# Patient Record
Sex: Female | Born: 1960 | Race: White | Hispanic: No | Marital: Married | Smoking: Never smoker
Health system: Southern US, Community
[De-identification: ages and names within clinical notes are randomized; demographics above are authoritative.]

## PROBLEM LIST (undated history)

## (undated) DIAGNOSIS — T7840XA Allergy, unspecified, initial encounter: Secondary | ICD-10-CM

## (undated) HISTORY — DX: Allergy, unspecified, initial encounter: T78.40XA

---

## 2001-04-25 ENCOUNTER — Other Ambulatory Visit: Admission: RE | Admit: 2001-04-25 | Discharge: 2001-04-25 | Payer: Self-pay | Admitting: Obstetrics and Gynecology

## 2001-10-28 ENCOUNTER — Encounter: Payer: Self-pay | Admitting: Obstetrics and Gynecology

## 2001-10-28 ENCOUNTER — Inpatient Hospital Stay (HOSPITAL_COMMUNITY): Admission: AD | Admit: 2001-10-28 | Discharge: 2001-10-28 | Payer: Self-pay | Admitting: Obstetrics and Gynecology

## 2001-11-19 ENCOUNTER — Inpatient Hospital Stay (HOSPITAL_COMMUNITY): Admission: AD | Admit: 2001-11-19 | Discharge: 2001-11-21 | Payer: Self-pay | Admitting: Obstetrics and Gynecology

## 2001-11-22 ENCOUNTER — Encounter: Admission: RE | Admit: 2001-11-22 | Discharge: 2001-12-22 | Payer: Self-pay | Admitting: Obstetrics and Gynecology

## 2001-12-23 ENCOUNTER — Encounter: Admission: RE | Admit: 2001-12-23 | Discharge: 2002-01-22 | Payer: Self-pay | Admitting: Obstetrics and Gynecology

## 2001-12-31 ENCOUNTER — Other Ambulatory Visit: Admission: RE | Admit: 2001-12-31 | Discharge: 2001-12-31 | Payer: Self-pay | Admitting: Obstetrics & Gynecology

## 2002-02-20 ENCOUNTER — Encounter: Admission: RE | Admit: 2002-02-20 | Discharge: 2002-03-22 | Payer: Self-pay | Admitting: Obstetrics and Gynecology

## 2002-04-22 ENCOUNTER — Encounter: Admission: RE | Admit: 2002-04-22 | Discharge: 2002-05-22 | Payer: Self-pay | Admitting: Obstetrics and Gynecology

## 2002-06-22 ENCOUNTER — Encounter: Admission: RE | Admit: 2002-06-22 | Discharge: 2002-07-22 | Payer: Self-pay | Admitting: Obstetrics and Gynecology

## 2003-07-15 ENCOUNTER — Other Ambulatory Visit: Admission: RE | Admit: 2003-07-15 | Discharge: 2003-07-15 | Payer: Self-pay | Admitting: Obstetrics and Gynecology

## 2004-09-22 ENCOUNTER — Other Ambulatory Visit: Admission: RE | Admit: 2004-09-22 | Discharge: 2004-09-22 | Payer: Self-pay | Admitting: Obstetrics and Gynecology

## 2004-10-01 ENCOUNTER — Encounter: Admission: RE | Admit: 2004-10-01 | Discharge: 2004-10-01 | Payer: Self-pay | Admitting: Obstetrics and Gynecology

## 2004-10-06 ENCOUNTER — Encounter: Admission: RE | Admit: 2004-10-06 | Discharge: 2004-10-06 | Payer: Self-pay | Admitting: Obstetrics and Gynecology

## 2004-10-06 ENCOUNTER — Encounter (INDEPENDENT_AMBULATORY_CARE_PROVIDER_SITE_OTHER): Payer: Self-pay | Admitting: *Deleted

## 2005-12-19 ENCOUNTER — Other Ambulatory Visit: Admission: RE | Admit: 2005-12-19 | Discharge: 2005-12-19 | Payer: Self-pay | Admitting: Obstetrics and Gynecology

## 2006-05-10 ENCOUNTER — Ambulatory Visit: Payer: Self-pay | Admitting: Family Medicine

## 2006-11-22 ENCOUNTER — Ambulatory Visit: Payer: Self-pay | Admitting: Family Medicine

## 2007-04-19 ENCOUNTER — Ambulatory Visit: Payer: Self-pay | Admitting: Family Medicine

## 2008-08-25 ENCOUNTER — Ambulatory Visit: Payer: Self-pay | Admitting: Family Medicine

## 2009-11-04 ENCOUNTER — Encounter: Admission: RE | Admit: 2009-11-04 | Discharge: 2009-11-04 | Payer: Self-pay | Admitting: Obstetrics and Gynecology

## 2010-01-07 ENCOUNTER — Ambulatory Visit: Payer: Self-pay | Admitting: Family Medicine

## 2010-08-20 ENCOUNTER — Ambulatory Visit: Payer: Self-pay | Admitting: Family Medicine

## 2011-11-04 ENCOUNTER — Ambulatory Visit (INDEPENDENT_AMBULATORY_CARE_PROVIDER_SITE_OTHER): Payer: 59 | Admitting: Medical

## 2011-11-04 ENCOUNTER — Encounter: Payer: Self-pay | Admitting: Medical

## 2011-11-04 VITALS — BP 98/60 | HR 68 | Temp 98.6°F | Resp 16 | Wt 106.5 lb

## 2011-11-04 DIAGNOSIS — R509 Fever, unspecified: Secondary | ICD-10-CM

## 2011-11-04 DIAGNOSIS — B349 Viral infection, unspecified: Secondary | ICD-10-CM | POA: Insufficient documentation

## 2011-11-04 DIAGNOSIS — B9789 Other viral agents as the cause of diseases classified elsewhere: Secondary | ICD-10-CM

## 2011-11-04 DIAGNOSIS — J029 Acute pharyngitis, unspecified: Secondary | ICD-10-CM | POA: Insufficient documentation

## 2011-11-04 LAB — POCT INFLUENZA A/B
Influenza A, POC: NEGATIVE
Influenza B, POC: NEGATIVE

## 2011-11-04 LAB — POCT RAPID STREP A (OFFICE): Rapid Strep A Screen: NEGATIVE

## 2011-11-04 NOTE — Progress Notes (Signed)
Subjective:   HPI  Cheryl Sanchez is a 50 y.o. female who presents with sore throat, fever, body aches x 1 days.  Symptoms began suddenly yesterday.  Ears hurt, glands feel swollen, tired and achy.  Occasional cough from dry throat.  No headache, no nausea, vomiting, diarrhea, belly pain.   She notes some nasal congestion, but no sinus pressure.  She did not get flu shot this year.  Was in doctor's office with her kids Monday and lots of sick kids there.   She has been around positive sick contacts with sinusitis and cold.  No other aggravating or relieving factors.  Using alka seltzer cold plus.   Using some Tylenol as well.   No other c/o.  The following portions of the patient's history were reviewed and updated as appropriate: allergies, current medications, past family history, past medical history, past social history, past surgical history and problem list.  Past Medical History  Diagnosis Date  . Allergy   . Endometriosis    Review of Systems Constitutional: +fever, -+Chills, +sweats, -unexpected -weight change,+fatigue ENT: -runny nose, +ear pain, +sore throat,+CONGESTION Cardiology:  -chest pain, -palpitations, -edema Respiratory: -cough, -shortness of breath, -wheezing Gastroenterology: -abdominal pain, -nausea, -vomiting, -diarrhea, -constipation Hematology: -bleeding or bruising problems Musculoskeletal: -arthralgias, -myalgias, -joint swelling, -back pain Ophthalmology: -vision changes Urology: -dysuria, -difficulty urinating, -hematuria, -urinary frequency, -urgency Neurology: -headache, -weakness, -tingling, -numbness   Objective:      General: Ill-appearing, well-developed, well-nourished, ill appearing Skin: warm, moist HEENT: Nose inflamed and congested, clear conjunctiva, TMs pearly, no sinus tenderness, pharynx with erythema, no exudates Neck: Supple, nontender, shotty cervical adenopathy Heart: Regular rate and rhythm, normal S1, S2, no murmurs Lungs: Clear  to auscultation bilaterally, no wheezes, rales, rhonchi Abdomen: Nontender non distended Extremities: Mild generalized tenderness      Assessment and Plan:   Encounter Diagnoses  Name Primary?  . Fever Yes  . Pharyngitis    Strep and Flu tests negative.  Despite this, given sick contacts, recent flu in community, will treat presumably for flu like viral illness.  Discussed supportive care, prevention, possible complications.  She will call if worse or not improving.

## 2011-11-10 ENCOUNTER — Ambulatory Visit (INDEPENDENT_AMBULATORY_CARE_PROVIDER_SITE_OTHER): Payer: 59 | Admitting: Family Medicine

## 2011-11-10 ENCOUNTER — Encounter: Payer: Self-pay | Admitting: Family Medicine

## 2011-11-10 VITALS — HR 77 | Temp 98.2°F | Wt 108.0 lb

## 2011-11-10 DIAGNOSIS — J111 Influenza due to unidentified influenza virus with other respiratory manifestations: Secondary | ICD-10-CM

## 2011-11-10 MED ORDER — AZITHROMYCIN 500 MG PO TABS: 500.0000 mg | ORAL_TABLET | Freq: Every day | ORAL | Status: AC

## 2011-11-10 NOTE — Progress Notes (Signed)
  Subjective:    Patient ID: Cheryl Sanchez, female    DOB: 06-25-1961, 50 y.o.   MRN: [id number]  HPI She complains of continued difficulty with cough ,rib pain, headache, fatigue and hoarse voice. No fever,chills sore throat   Review of Systems     Objective:   Physical Exam alert and in no distress. Tympanic membranes and canals are normal. Throat is clear. Tonsils are normal. Neck is supple without adenopathy or thyromegaly. Cardiac exam shows a regular sinus rhythm without murmurs or gallops. Lungs are clear to auscultation.        Assessment & Plan:  Bronchitis. Treat with azithromycin. Call if no better at the end of the med

## 2011-11-10 NOTE — Patient Instructions (Signed)
Use Advil 4 tabs three times a day.Call if not back to normal in one week

## 2011-11-11 ENCOUNTER — Telehealth: Payer: Self-pay | Admitting: Family Medicine

## 2011-11-11 NOTE — Telephone Encounter (Signed)
DONE

## 2011-11-23 ENCOUNTER — Telehealth: Payer: Self-pay | Admitting: Internal Medicine

## 2011-11-23 MED ORDER — AZITHROMYCIN 500 MG PO TABS: 500.0000 mg | ORAL_TABLET | Freq: Every day | ORAL | Status: AC

## 2011-11-23 NOTE — Telephone Encounter (Signed)
Azithromycin called in. If she still has difficulty in another week, she will return for reevaluation

## 2011-12-05 ENCOUNTER — Other Ambulatory Visit (INDEPENDENT_AMBULATORY_CARE_PROVIDER_SITE_OTHER): Payer: 59

## 2011-12-05 ENCOUNTER — Telehealth: Payer: Self-pay | Admitting: *Deleted

## 2011-12-05 DIAGNOSIS — Z23 Encounter for immunization: Secondary | ICD-10-CM

## 2011-12-05 NOTE — Telephone Encounter (Signed)
Cheryl Sanchez, this was the patient that was on the nurse schedule for a Tdap that you assessed in the lab room. Thanks.

## 2011-12-05 NOTE — Telephone Encounter (Signed)
I saw pt briefly today when she came for Tdap booster.  Her right thumb landed on a rusty carpet tack.  No redness, or induration, no warmth.  She had cleaned the wound nicely.   Advise watch and wait approach.  No antibiotic today.   Tdap update.   She will return if worsening signs of infection.

## 2012-06-28 ENCOUNTER — Encounter: Payer: Self-pay | Admitting: Family Medicine

## 2012-06-28 ENCOUNTER — Ambulatory Visit (INDEPENDENT_AMBULATORY_CARE_PROVIDER_SITE_OTHER): Payer: BC Managed Care – PPO | Admitting: Family Medicine

## 2012-06-28 VITALS — BP 120/70 | Wt 114.0 lb

## 2012-06-28 DIAGNOSIS — L259 Unspecified contact dermatitis, unspecified cause: Secondary | ICD-10-CM

## 2012-06-28 MED ORDER — SODIUM CHLORIDE 0.9 % IV SOLN: 125.0000 mg | Freq: Once | INTRAVENOUS | Status: DC

## 2012-06-28 MED ORDER — METHYLPREDNISOLONE SODIUM SUCC 125 MG IJ SOLR
125.0000 mg | Freq: Once | INTRAMUSCULAR | Status: AC
  Administered 2012-06-28: 125 mg via INTRAMUSCULAR

## 2012-06-28 MED ORDER — PREDNISONE 10 MG PO KIT: PACK | ORAL | Status: DC

## 2012-06-28 NOTE — Progress Notes (Signed)
  Subjective:    Patient ID: Cheryl Sanchez, female    DOB: [birth year], 51 y.o.   MRN: [id number]  HPI She complains of a three-day history of redness and itching on various spots of her body. She has no history of exposure to any plant materials. She does have a cat.   Review of Systems     Objective:   Physical Exam Scattered erythematous lesions some of which are linear in nature noted. She also has a roundish follicular area that is erythematous and slightly warm on her right anterior thigh.       Assessment & Plan:   1. Contact dermatitis  methylPREDNISolone sodium succinate (SOLU-MEDROL) 130 mg in sodium chloride 0.9 % 50 mL IVPB, PredniSONE (STERAPRED DS) 10 MG KIT   she will let me know how the lesion on her thighs progressing and if it gets worse, possible antibiotic will be given. Also recommend she washed her clothes in hot soapy water

## 2012-09-11 ENCOUNTER — Other Ambulatory Visit (INDEPENDENT_AMBULATORY_CARE_PROVIDER_SITE_OTHER): Payer: BC Managed Care – PPO

## 2012-09-11 DIAGNOSIS — Z23 Encounter for immunization: Secondary | ICD-10-CM

## 2012-10-01 ENCOUNTER — Ambulatory Visit (INDEPENDENT_AMBULATORY_CARE_PROVIDER_SITE_OTHER): Payer: BC Managed Care – PPO | Admitting: Family Medicine

## 2012-10-01 ENCOUNTER — Encounter: Payer: Self-pay | Admitting: Family Medicine

## 2012-10-01 VITALS — BP 140/80 | HR 80 | Ht 62.0 in | Wt 121.0 lb

## 2012-10-01 DIAGNOSIS — S0990XA Unspecified injury of head, initial encounter: Secondary | ICD-10-CM

## 2012-10-01 NOTE — Progress Notes (Signed)
Chief Complaint  Patient presents with  . Head Injury    tent pole fell on her head this am, around 11am. Having HA, feels a little sleepy and having some fuzzy headed feeling.    HPI:  This morning, around 11 am, while taking a tent down after an event, one of the horizontal poles slipped out of joint, and slid down and hit her in the center of the top of her head, knocking her to the ground.  Denies any LOC.  She put ice on her head, which felt better, and she continued to work, unloading.  She had some mild nausea, but hadn't eaten anything.  Nausea resolved after eating. After going back to work she had trouble concentrating and hard to concentrate to look at computer screen.  Denies any visual complaints.  Complaining of headache.  Took 2 ibuprofen, and headache is somewhat better.  Past Medical History  Diagnosis Date  . Allergy   . Endometriosis    History   Social History  . Marital Status: Single    Spouse Name: N/A    Number of Children: N/A  . Years of Education: N/A   Occupational History  . Not on file.   Social History Main Topics  . Smoking status: Never Smoker   . Smokeless tobacco: Not on file  . Alcohol Use: No  . Drug Use: No  . Sexually Active: Not on file   Other Topics Concern  . Not on file   Social History Narrative  . No narrative on file   Meds: none currently No Known Allergies  ROS: Denies numbness, tingling, vertigo, weakness, GI/GU problems, bleeding/bruising or other concerns.  No fevers, URI symptoms, shortness of breath, chest pain.  PHYSICAL EXAM: BP 140/80  Pulse 80  Ht 5\' 2"  (1.575 m)  Wt 121 lb (54.885 kg)  BMI 22.13 kg/m2  LMP 10/13/2011 Well developed, pleasant female in no distress HEENT:  PERRL, EOMI, fundi benign, conjunctiva clear.  TM's and EAC's normal ,OP clear.  Tender over mild soft tissue swelling anterior scalp.  Skin intact. Neck: no lymphadenopathy or mass.  No spine tenderness Back: no spine tenderness Neuro:  alert and oriented x 4.  Cranial nerves intact.  Normal strength, sensation.  Normal finger to nose, heel/toe and tandem gait.  Normal rapid alternating movement and no pronator drift.  DTR's symmetric  ASSESSMENT/PLAN: 1. Closed head injury    Very mild concussive symptoms.  Reviewed expected course, and symptoms that are red flags to return to ER. She is feeling tired, yawning, asking about taking a nap.  Her husband is off today and can check on her.  To ER if further mental status changes.  Reassured regarding normal neuro exam today.

## 2012-10-01 NOTE — Patient Instructions (Signed)
Head Injury, Adult  You have had a head injury that does not appear serious at this time. A concussion is a state of changed mental ability, usually from a blow to the head. You should take clear liquids for the rest of the day and then resume your regular diet. You should not take sedatives or alcoholic beverages for as long as directed by your caregiver after discharge. After injuries such as yours, most problems occur within the first 24 hours.  SYMPTOMS  These minor symptoms may be experienced after discharge:   Memory difficulties.   Dizziness.   Headaches.   Double vision.   Hearing difficulties.   Depression.   Tiredness.   Weakness.   Difficulty with concentration.  If you experience any of these problems, you should not be alarmed. A concussion requires a few days for recovery. Many patients with head injuries frequently experience such symptoms. Usually, these problems disappear without medical care. If symptoms last for more than one day, notify your caregiver. See your caregiver sooner if symptoms are becoming worse rather than better.  HOME CARE INSTRUCTIONS    During the next 24 hours you must stay with someone who can watch you for the warning signs listed below.  Although it is unlikely that serious side effects will occur, you should be aware of signs and symptoms which may necessitate your return to this location. Side effects may occur up to 7  10 days following the injury. It is important for you to carefully monitor your condition and contact your caregiver or seek immediate medical attention if there is a change in your condition.  SEEK IMMEDIATE MEDICAL CARE IF:    There is confusion or drowsiness.   You can not awaken the injured person.   There is nausea (feeling sick to your stomach) or continued, forceful vomiting.   You notice dizziness or unsteadiness which is getting worse, or inability to walk.   You have convulsions or unconsciousness.   You experience severe,  persistent headaches not relieved by over-the-counter or prescription medicines for pain. (Do not take aspirin as this impairs clotting abilities). Take other pain medications only as directed.   You can not use arms or legs normally.   There is clear or bloody discharge from the nose or ears.  MAKE SURE YOU:    Understand these instructions.   Will watch your condition.   Will get help right away if you are not doing well or get worse.  Document Released: 11/07/2005 Document Revised: 01/30/2012 Document Reviewed: 09/25/2009  [employer] Patient Information 2013 [employer]

## 2012-10-02 ENCOUNTER — Encounter: Payer: Self-pay | Admitting: Family Medicine

## 2012-10-15 ENCOUNTER — Encounter: Payer: Self-pay | Admitting: Family Medicine

## 2012-10-15 ENCOUNTER — Ambulatory Visit (INDEPENDENT_AMBULATORY_CARE_PROVIDER_SITE_OTHER): Payer: BC Managed Care – PPO | Admitting: Family Medicine

## 2012-10-15 VITALS — BP 114/72 | HR 79 | Temp 98.0°F | Wt 119.0 lb

## 2012-10-15 DIAGNOSIS — R300 Dysuria: Secondary | ICD-10-CM

## 2012-10-15 DIAGNOSIS — N390 Urinary tract infection, site not specified: Secondary | ICD-10-CM

## 2012-10-15 LAB — POCT URINALYSIS DIPSTICK
Blood, UA: 250
Glucose, UA: NEGATIVE
Protein, UA: NEGATIVE

## 2012-10-15 MED ORDER — SULFAMETHOXAZOLE-TRIMETHOPRIM 800-160 MG PO TABS: 1.0000 | ORAL_TABLET | Freq: Two times a day (BID) | ORAL | Status: DC

## 2012-10-15 NOTE — Patient Instructions (Signed)
Use Azo-Standard to help with your

## 2012-10-15 NOTE — Progress Notes (Signed)
  Subjective:    Patient ID: Cheryl Sanchez, female    DOB: 1961-02-12, 51 y.o.   MRN: [id number]  HPI She has a two-day history of dysuria and urgency as well as a slight vaginal discharge. No fever, chills, abdominal or back pain   Review of Systems     Objective:   Physical Exam Alert and in no distress. Urine microscopic did show bacteria TNTC      Assessment & Plan:   1. Painful urging to urinate  POCT Urinalysis Dipstick  2. UTI (lower urinary tract infection)  sulfamethoxazole-trimethoprim (BACTRIM DS,SEPTRA DS) 800-160 MG per tablet   she will call me on Thursday is still having difficulty. Also recommended that she try Azo-Standard

## 2013-02-19 ENCOUNTER — Ambulatory Visit (INDEPENDENT_AMBULATORY_CARE_PROVIDER_SITE_OTHER): Payer: BC Managed Care – PPO | Admitting: Family Medicine

## 2013-02-19 ENCOUNTER — Encounter: Payer: Self-pay | Admitting: Family Medicine

## 2013-02-19 VITALS — BP 140/90 | HR 78 | Wt 117.0 lb

## 2013-02-19 DIAGNOSIS — S0003XA Contusion of scalp, initial encounter: Secondary | ICD-10-CM

## 2013-02-19 DIAGNOSIS — IMO0002 Reserved for concepts with insufficient information to code with codable children: Secondary | ICD-10-CM

## 2013-02-19 DIAGNOSIS — S300XXA Contusion of lower back and pelvis, initial encounter: Secondary | ICD-10-CM

## 2013-02-19 DIAGNOSIS — S50312A Abrasion of left elbow, initial encounter: Secondary | ICD-10-CM

## 2013-02-19 NOTE — Patient Instructions (Addendum)
Take Tylenol, Advil or Aleve ,whatever works the best for you for aches and pains. Be aware of worsening headache, continued vomiting and loss of orientation. If that occurs need to go to hospital. Be forwarned ,you can have 3 or 4 days of aching. Ice to the head and elbow as well as the back for 20 minutes 3 or 4 times per day for the first several days then you can switch to heat. Come back here as needed
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
## 2013-02-22 ENCOUNTER — Other Ambulatory Visit: Payer: Self-pay

## 2013-02-22 ENCOUNTER — Telehealth: Payer: Self-pay | Admitting: Family Medicine

## 2013-02-22 ENCOUNTER — Ambulatory Visit
Admission: RE | Admit: 2013-02-22 | Discharge: 2013-02-22 | Disposition: A | Discharge status: Home / Self Care | Payer: BC Managed Care – PPO | Source: Ambulatory Visit | Attending: Family Medicine | Admitting: Family Medicine

## 2013-02-22 DIAGNOSIS — R51 Headache: Secondary | ICD-10-CM

## 2013-02-22 DIAGNOSIS — S0003XA Contusion of scalp, initial encounter: Secondary | ICD-10-CM

## 2013-02-22 DIAGNOSIS — S1093XA Contusion of unspecified part of neck, initial encounter: Secondary | ICD-10-CM

## 2013-02-22 NOTE — Telephone Encounter (Signed)
Pt has been set up for ct of head w/o contrast today at [address] [facility] arive at 5 for a 5:15  Pt is aware and auth # [id number] they were told to call report to [facility]

## 2013-02-22 NOTE — Telephone Encounter (Signed)
Patient called, she is no better from her visit here earlier in the week. Still having dizziness and headaches. Per Dr. Susann Givens, since there is no improvement, he wants patient to had head CT without contrast. Informed patient of this and told her Dr. Jola Babinski nurse will schedule appointment and call her back.

## 2013-02-24 NOTE — Progress Notes (Signed)
Quick Note:  I called with this information and asked her to call me next week. Discussed worsening symptoms of headache nausea and vomiting and loss of orientation. ______

## 2013-05-06 ENCOUNTER — Encounter: Payer: Self-pay | Admitting: Family Medicine

## 2013-05-06 ENCOUNTER — Ambulatory Visit (INDEPENDENT_AMBULATORY_CARE_PROVIDER_SITE_OTHER): Payer: BC Managed Care – PPO | Admitting: Family Medicine

## 2013-05-06 VITALS — BP 120/70 | HR 68 | Temp 97.9°F | Ht 62.0 in | Wt 117.0 lb

## 2013-05-06 DIAGNOSIS — L255 Unspecified contact dermatitis due to plants, except food: Secondary | ICD-10-CM

## 2013-05-06 DIAGNOSIS — W57XXXA Bitten or stung by nonvenomous insect and other nonvenomous arthropods, initial encounter: Secondary | ICD-10-CM

## 2013-05-06 NOTE — Patient Instructions (Addendum)
  Supportive management with topical steroids prn itching, topical antihistamines as needed for itching.  I recommend use of zyrtec and/or benadryl for the itching and hive-like areas on forearm.  Return if symptoms persist, worsen (ie fevers, drainage, warmth, red streaks, increased size of ulceration, pain, etc.)

## 2013-05-06 NOTE — Progress Notes (Signed)
Chief Complaint  Patient presents with  . Insect Bite    was weeding in the garden Saturday, thinks she got bit by a bug, doesn't know what kind. Somewhat itchy, no pain.   2 days ago she was weeding in her yard.  She saw a brown spot on her right anterior forearm.  She later came in contact with poison ivy.  She never typically gets much reaction to poison ivy, however she did get a rash and itching this time.  The area of rash is also the right forearm, and hasn't spread elsewhere on body.  It is itchy.  The original lesion/area of concern is slightly itchy. It hasn't drained, been painful, or increased in size.  She is concerned due to the color/scabbing, and not being sure of what bit her.  Denies fevers, tick bite, pain, just mild itching, with subsequent "poison ivy" which is itchy.  Past Medical History  Diagnosis Date  . Allergy   . Endometriosis    possibly History   Social History  . Marital Status: Married    Spouse Name: N/A    Number of Children: N/A  . Years of Education: N/A   Occupational History  . Not on file.   Social History Main Topics  . Smoking status: Never Smoker   . Smokeless tobacco: Not on file  . Alcohol Use: Yes     Comment: 1-2 glasses 2-3 times per week.  . Drug Use: No  . Sexually Active: Not on file   Other Topics Concern  . Not on file   Social History Narrative  . No narrative on file    No current outpatient prescriptions on file prior to visit.   No current facility-administered medications on file prior to visit.   No Known Allergies  ROS:  Denies fevers, myalgias, joint pains, bleeding/bruising, URI symptoms or other concerns. No headaches, dizziness, shortness of breath, cough.   PHYSICAL EXAM: BP 120/70  Pulse 68  Temp(Src) 97.9 F (36.6 C) (Oral)  Ht 5\' 2"  (1.575 m)  Wt 117 lb (53.071 kg)  BMI 21.39 kg/m2  LMP 06/09/2012 Pleasant female in no distress  Skin R forearm: 3x3 mm area of very superficial ulceration, light  brown in color.  There is surrounding wheal (raised area of erythema), as there are also multiple vesicles and papules with surrounding raised erythema/wheals due to known poison ivy exposure. No warmth, drainage, streaking  ASSESSMENT/PLAN: Plant dermatitis  Insect bite  Bug bite--most likely, vs puncture wound or other trauma.  Not infected.  Overlying plant dermatitis is affecting the actual appearance. Very mild superficial ulceration. Reviewed signs/symptoms of infection.  Doubt brown recluse--to return if increasing area of ulceration or necrosis develops.  Supportive management with topical steroids prn itching, topical antihistamines as needed for itching.  I recommend use of zyrtec and/or benadryl for the itching and hive-like areas on forearm.  Return if symptoms persist, worsen (ie fevers, drainage, warmth, red streaks, increased size of ulceration, pain, etc.)
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
## 2014-05-07 ENCOUNTER — Ambulatory Visit (INDEPENDENT_AMBULATORY_CARE_PROVIDER_SITE_OTHER): Payer: BC Managed Care – PPO | Admitting: Family Medicine

## 2014-05-07 ENCOUNTER — Encounter: Payer: Self-pay | Admitting: Family Medicine

## 2014-05-07 VITALS — BP 120/88 | Wt 119.0 lb

## 2014-05-07 DIAGNOSIS — J209 Acute bronchitis, unspecified: Secondary | ICD-10-CM

## 2014-05-07 MED ORDER — AZITHROMYCIN 500 MG PO TABS: 500.0000 mg | ORAL_TABLET | Freq: Every day | ORAL | Status: DC

## 2014-05-07 NOTE — Progress Notes (Signed)
   Subjective:    Patient ID: Cheryl Sanchez, female    DOB: [birth year], 53 y.o.   MRN: [id number]  HPI She has a two-week history this started with fatigue and cough followed by worsening of the pubis symptoms. No fever, chills, sore throat or earache.  Review of Systems     Objective:   Physical Exam alert and in no distress. Tympanic membranes and canals are normal. Throat is clear. Tonsils are normal. Neck is supple without adenopathy or thyromegaly. Cardiac exam shows a regular sinus rhythm without murmurs or gallops. Lungs are clear to auscultation.        Assessment & Plan:  Acute bronchitis - Plan: azithromycin (ZITHROMAX) 500 MG tablet  I explained that this is probably an atypical bronchitis. She is to call me after one week to let me know how she is doing. Discussed more medication or possibly doing further workup based on her response.

## 2014-05-07 NOTE — Patient Instructions (Signed)
Take the pills for the next 3 days however the medicine will stay in your system and work for a full week. If you're having any trouble at the end of the week call me

## 2015-01-26 ENCOUNTER — Ambulatory Visit (INDEPENDENT_AMBULATORY_CARE_PROVIDER_SITE_OTHER): Payer: BLUE CROSS/BLUE SHIELD | Admitting: Family Medicine

## 2015-01-26 ENCOUNTER — Encounter: Payer: Self-pay | Admitting: Family Medicine

## 2015-01-26 VITALS — BP 136/82 | HR 87 | Temp 98.0°F | Wt 120.0 lb

## 2015-01-26 DIAGNOSIS — R309 Painful micturition, unspecified: Secondary | ICD-10-CM

## 2015-01-26 DIAGNOSIS — N390 Urinary tract infection, site not specified: Secondary | ICD-10-CM | POA: Diagnosis not present

## 2015-01-26 DIAGNOSIS — J0110 Acute frontal sinusitis, unspecified: Secondary | ICD-10-CM

## 2015-01-26 LAB — POCT URINALYSIS DIPSTICK
BILIRUBIN UA: NEGATIVE
Glucose, UA: NEGATIVE
KETONES UA: NEGATIVE
Nitrite, UA: NEGATIVE
PH UA: 6
PROTEIN UA: NEGATIVE
Urobilinogen, UA: NEGATIVE

## 2015-01-26 MED ORDER — SULFAMETHOXAZOLE-TRIMETHOPRIM 800-160 MG PO TABS: 1.0000 | ORAL_TABLET | Freq: Two times a day (BID) | ORAL | Status: DC

## 2015-01-26 NOTE — Progress Notes (Signed)
   Subjective:    Patient ID: Cheryl Sanchez, female    DOB: 06/06/1961, 54 y.o.   MRN: [id number]  HPI  she complains of a five-day history of with sore throat, headache, rhinorrhea, PND with malaise and fatigue. No cough, sneezing, earache. She also complains of a one-day history of dysuria, urgency and frequency. She has tried Azo-Standard with not much success.   Review of Systems     Objective:   Physical Exam Alert and in no distress.  Nasal mucosa is normal with tenderness over frontal and maxillary sinusesTympanic membranes and canals are normal. Pharyngeal area is normal. Neck is supple without adenopathy or thyromegaly. Cardiac exam shows a regular sinus rhythm without murmurs or gallops. Lungs are clear to auscultation.  urine microscopic and dipstick was positive.      Assessment & Plan:  Painful urination - Plan: POCT urinalysis dipstick  UTI (lower urinary tract infection) - Plan: sulfamethoxazole-trimethoprim (BACTRIM DS,SEPTRA DS) 800-160 MG per tablet  Acute frontal sinusitis, recurrence not specified - Plan: sulfamethoxazole-trimethoprim (BACTRIM DS,SEPTRA DS) 800-160 MG per tablet  will call if not entirely better when she finishes the antibiotic.

## 2015-08-07 ENCOUNTER — Ambulatory Visit (INDEPENDENT_AMBULATORY_CARE_PROVIDER_SITE_OTHER): Payer: BLUE CROSS/BLUE SHIELD | Admitting: Medical

## 2015-08-07 ENCOUNTER — Encounter: Payer: Self-pay | Admitting: Medical

## 2015-08-07 VITALS — BP 108/70 | HR 80 | Temp 98.2°F | Resp 18 | Wt 121.4 lb

## 2015-08-07 DIAGNOSIS — N3000 Acute cystitis without hematuria: Secondary | ICD-10-CM

## 2015-08-07 DIAGNOSIS — N390 Urinary tract infection, site not specified: Secondary | ICD-10-CM | POA: Diagnosis not present

## 2015-08-07 LAB — POCT URINALYSIS DIPSTICK
Bilirubin, UA: NEGATIVE
Glucose, UA: NEGATIVE
Ketones, UA: NEGATIVE
NITRITE UA: NEGATIVE
PH UA: 6
PROTEIN UA: NEGATIVE
Spec Grav, UA: 1.02
Urobilinogen, UA: NEGATIVE

## 2015-08-07 MED ORDER — SULFAMETHOXAZOLE-TRIMETHOPRIM 800-160 MG PO TABS: 1.0000 | ORAL_TABLET | Freq: Two times a day (BID) | ORAL | Status: DC

## 2015-08-07 NOTE — Progress Notes (Signed)
Subjective:  Cheryl Sanchez is a 54 y.o. female who complains of possible urinary tract infection.  She has had symptoms for 1 week.  Symptoms include burning with urination, urinary frequncy, mild abdominal pressure. Patient denies back pain, fever and vaginal discharge.  Last UTI was 01/2015.   Using nothing for current symptoms.    Patient does not have a history of recurrent UTI. Patient does not have a history of pyelonephritis.  No other aggravating or relieving factors.  No other c/o.  Past Medical History  Diagnosis Date  . Allergy   . Endometriosis     ROS as in subjective  Reviewed allergies, medications, past medical, surgical, and social history.    Objective: Filed Vitals:   08/07/15 1158  BP: 108/70  Pulse: 80  Temp: 98.2 F (36.8 C)  Resp: 18    General appearance: alert, no distress, WD/WN, female Abdomen: +bs, soft, mild suprapubic tenderness, otherwise non tender, non distended, no masses, no hepatomegaly, no splenomegaly, no bruits Back: no CVA tenderness GU: deferred      Assessment: Encounter Diagnoses  Name Primary?  . Acute cystitis without hematuria Yes  . UTI (lower urinary tract infection)      Plan: Discussed symptoms, diagnosis, possible complications, and usual course of illness.  Contributing factor could be menopausal changes in the genitourinary region.  Discussed that if she c/t to get frequent UTIs, may consult with her gynecologist about premarin cream.  Begin medication Bactrim per orders below.  Advised increased water intake, can use OTC Tylenol for pain.  Urine culture sent.  Call or return if worse or not improving.  Cheryl Sanchez was seen today for possible uti.  Diagnoses and all orders for this visit:  Acute cystitis without hematuria -     sulfamethoxazole-trimethoprim (BACTRIM DS,SEPTRA DS) 800-160 MG per tablet; Take 1 tablet by mouth 2 (two) times daily. -     Urine culture -     Urinalysis Dipstick  UTI (lower urinary  tract infection) -     sulfamethoxazole-trimethoprim (BACTRIM DS,SEPTRA DS) 800-160 MG per tablet; Take 1 tablet by mouth 2 (two) times daily. -     Urine culture -     Urinalysis Dipstick

## 2015-08-10 LAB — URINE CULTURE

## 2015-08-12 ENCOUNTER — Telehealth: Payer: Self-pay | Admitting: Medical

## 2015-08-12 ENCOUNTER — Other Ambulatory Visit: Payer: Self-pay | Admitting: Medical

## 2015-08-12 MED ORDER — CIPROFLOXACIN HCL 500 MG PO TABS: 500.0000 mg | ORAL_TABLET | Freq: Two times a day (BID) | ORAL | Status: DC

## 2015-08-12 NOTE — Telephone Encounter (Signed)
Pt called and was wanting to know if you could send her something else for a UTI, says its not any better, pt uses [facility] at [address], pt number is [phone]

## 2015-08-12 NOTE — Telephone Encounter (Signed)
cipro sent.  Recheck if not much better in a week, plan to repeat morning clean catch UA in 2 wk nurse visit

## 2015-08-26 ENCOUNTER — Other Ambulatory Visit (INDEPENDENT_AMBULATORY_CARE_PROVIDER_SITE_OTHER): Payer: BLUE CROSS/BLUE SHIELD

## 2015-08-26 DIAGNOSIS — R319 Hematuria, unspecified: Secondary | ICD-10-CM

## 2015-08-26 LAB — POCT URINALYSIS DIPSTICK
Bilirubin, UA: NEGATIVE
Blood, UA: POSITIVE
GLUCOSE UA: NEGATIVE
Ketones, UA: NEGATIVE
Leukocytes, UA: NEGATIVE
NITRITE UA: NEGATIVE
Protein, UA: NEGATIVE
SPEC GRAV UA: 1.025
UROBILINOGEN UA: NEGATIVE
pH, UA: 6.5

## 2015-12-13 ENCOUNTER — Ambulatory Visit (INDEPENDENT_AMBULATORY_CARE_PROVIDER_SITE_OTHER): Payer: 59 | Admitting: Family Medicine

## 2015-12-13 VITALS — BP 128/78 | HR 98 | Temp 98.4°F | Resp 16 | Ht 62.0 in | Wt 126.0 lb

## 2015-12-13 DIAGNOSIS — R0981 Nasal congestion: Secondary | ICD-10-CM | POA: Diagnosis not present

## 2015-12-13 DIAGNOSIS — R5383 Other fatigue: Secondary | ICD-10-CM

## 2015-12-13 DIAGNOSIS — R05 Cough: Secondary | ICD-10-CM | POA: Diagnosis not present

## 2015-12-13 DIAGNOSIS — R51 Headache: Secondary | ICD-10-CM | POA: Diagnosis not present

## 2015-12-13 DIAGNOSIS — M791 Myalgia: Secondary | ICD-10-CM

## 2015-12-13 LAB — POCT INFLUENZA A/B
Influenza A, POC: NEGATIVE
Influenza B, POC: NEGATIVE

## 2015-12-13 MED ORDER — AMOXICILLIN-POT CLAVULANATE 875-125 MG PO TABS: 1.0000 | ORAL_TABLET | Freq: Two times a day (BID) | ORAL | Status: DC

## 2015-12-13 MED ORDER — FLUCONAZOLE 150 MG PO TABS
150.0000 mg | ORAL_TABLET | Freq: Once | ORAL | Status: DC
Start: 2015-12-13 — End: 2017-06-05

## 2015-12-13 NOTE — Progress Notes (Signed)
By signing my name below, I, Stann Ore, attest that this documentation has been prepared under the direction and in the presence of Elvina Sidle, MD. Electronically Signed: Stann Ore, Scribe. 12/13/2015 , 9:31 AM .  Patient was seen in room 4 .   Patient ID: Cheryl Sanchez MRN: [id number], DOB: 1961-04-09, 55 y.o. Date of Encounter: 12/13/2015  Primary Physician: Carollee Herter, MD  Chief Complaint:  Chief Complaint  Patient presents with  . Headache    x 1 day   . Generalized Body Aches  . Cough  . Nasal Congestion    HPI:  Cheryl Sanchez is a 55 y.o. female who presents to [facility] complaining of headache with cough and congestion. She states that yesterday she had a head cold. She felt general myalgia this morning. She denies having a flu shot this season.   She works for [employer] for [employer], Teacher, music.   Past Medical History  Diagnosis Date  . Allergy   . Endometriosis      Home Meds: Prior to Admission medications   Medication Sig Start Date End Date Taking? Authorizing Provider  ciprofloxacin (CIPRO) 500 MG tablet Take 1 tablet (500 mg total) by mouth 2 (two) times daily. 08/12/15   Kermit Balo Tysinger, PA-C  loratadine-pseudoephedrine (CLARITIN-D 12-HOUR) 5-120 MG per tablet Take 1 tablet by mouth 2 (two) times daily.    Historical Provider, MD  sulfamethoxazole-trimethoprim (BACTRIM DS,SEPTRA DS) 800-160 MG per tablet Take 1 tablet by mouth 2 (two) times daily. 08/07/15   Jac Canavan, PA-C    Allergies: No Known Allergies  Social History   Social History  . Marital Status: Married    Spouse Name: N/A  . Number of Children: N/A  . Years of Education: N/A   Occupational History  . Not on file.   Social History Main Topics  . Smoking status: Never Smoker   . Smokeless tobacco: Never Used  . Alcohol Use: 0.0 oz/week    0 Standard drinks or equivalent per week     Comment: 1-2 glasses 2-3 times  per week.  . Drug Use: No  . Sexual Activity: Yes   Other Topics Concern  . Not on file   Social History Narrative     Review of Systems: Constitutional: negative for fever, chills, night sweats, weight changes; positive for fatigue  HEENT: negative for vision changes, hearing loss, rhinorrhea, ST, epistaxis, or sinus pressure; positive for congestion Cardiovascular: negative for chest pain or palpitations Respiratory: negative for hemoptysis, wheezing, shortness of breath; positive for cough Abdominal: negative for abdominal pain, nausea, vomiting, diarrhea, or constipation Dermatological: negative for rash Neurologic: negative for dizziness, or syncope; positive for headache Musc: positive for myalgia All other systems reviewed and are otherwise negative with the exception to those above and in the HPI.  Physical Exam: Blood pressure 128/78, pulse 98, temperature 98.4 F (36.9 C), temperature source Oral, resp. rate 16, height  (1.575 m), weight 126 lb (57.153 kg), last menstrual period 06/09/2012, SpO2 98 %., Body mass index is 23.04 kg/(m^2). General: Well developed, well nourished, in no acute distress. Head: Normocephalic, atraumatic, eyes without discharge, sclera non-icteric, nares are without discharge. Bilateral auditory canals clear, TM's are without perforation, pearly grey and translucent with reflective cone of light bilaterally. Oral cavity moist, posterior pharynx without exudate, erythema, peritonsillar abscess, or post nasal drip.  Neck: Supple. No thyromegaly. Full ROM. No lymphadenopathy. Lungs: Clear bilaterally to auscultation without wheezes,  rales, or rhonchi. Breathing is unlabored. Heart: RRR with S1 S2. No murmurs, rubs, or gallops appreciated. Msk:  Strength and tone normal for age. Extremities/Skin: Warm and dry. No clubbing or cyanosis. No edema. No rashes or suspicious lesions. Neuro: Alert and oriented X 3. Moves all extremities spontaneously. Gait  is normal. CNII-XII grossly in tact. Psych:  Responds to questions appropriately with a normal affect.   Labs: Results for orders placed or performed in visit on 12/13/15  POCT Influenza A/B  Result Value Ref Range   Influenza A, POC Negative Negative   Influenza B, POC Negative Negative     ASSESSMENT AND PLAN:  55 y.o. year old female with Sinus congestion - Plan: POCT Influenza A/B, amoxicillin-clavulanate (AUGMENTIN) 875-125 MG tablet  This chart was scribed in my presence and reviewed by me personally.     Signed, Elvina Sidle, MD 12/13/2015 9:31 AM

## 2015-12-13 NOTE — Patient Instructions (Signed)

## 2016-10-18 ENCOUNTER — Telehealth: Payer: Self-pay | Admitting: Family Medicine

## 2016-10-18 NOTE — Telephone Encounter (Signed)
Pt called Cheryl Sanchez stating she had a workers comp claim from months ago.  I called her back and left her a message stating she needed claim info, address, who gave her permission to be seen here and something in writing that she can been seen here if possible.

## 2016-12-18 ENCOUNTER — Other Ambulatory Visit: Payer: Self-pay | Admitting: Family Medicine

## 2016-12-18 MED ORDER — SULFAMETHOXAZOLE-TRIMETHOPRIM 800-160 MG PO TABS: 1.0000 | ORAL_TABLET | Freq: Two times a day (BID) | ORAL | 0 refills | Status: DC

## 2017-02-22 ENCOUNTER — Other Ambulatory Visit: Payer: Self-pay | Admitting: Occupational Medicine

## 2017-02-22 ENCOUNTER — Ambulatory Visit: Payer: Self-pay

## 2017-02-22 DIAGNOSIS — M25511 Pain in right shoulder: Secondary | ICD-10-CM

## 2017-06-05 ENCOUNTER — Encounter: Payer: Self-pay | Admitting: Family Medicine

## 2017-06-05 ENCOUNTER — Ambulatory Visit (INDEPENDENT_AMBULATORY_CARE_PROVIDER_SITE_OTHER): Payer: 59 | Admitting: Family Medicine

## 2017-06-05 VITALS — BP 112/60 | HR 87 | Temp 98.0°F | Wt 125.0 lb

## 2017-06-05 DIAGNOSIS — R350 Frequency of micturition: Secondary | ICD-10-CM

## 2017-06-05 DIAGNOSIS — M7581 Other shoulder lesions, right shoulder: Secondary | ICD-10-CM | POA: Diagnosis not present

## 2017-06-05 DIAGNOSIS — B349 Viral infection, unspecified: Secondary | ICD-10-CM

## 2017-06-05 LAB — POCT URINALYSIS DIP (PROADVANTAGE DEVICE)
Bilirubin, UA: NEGATIVE
GLUCOSE UA: NEGATIVE mg/dL
Ketones, POC UA: NEGATIVE mg/dL
LEUKOCYTES UA: NEGATIVE
Nitrite, UA: NEGATIVE
PROTEIN UA: NEGATIVE mg/dL
Specific Gravity, Urine: 1.03
UUROB: NEGATIVE
pH, UA: 5.5 (ref 5.0–8.0)

## 2017-06-05 MED ORDER — LIDOCAINE HCL 2 % IJ SOLN
3.0000 mL | Freq: Once | INTRAMUSCULAR | Status: AC
  Administered 2017-06-05: 60 mg via INTRADERMAL

## 2017-06-05 MED ORDER — TRIAMCINOLONE ACETONIDE 40 MG/ML IJ SUSP
40.0000 mg | Freq: Once | INTRAMUSCULAR | Status: AC
  Administered 2017-06-05: 40 mg via INTRAMUSCULAR

## 2017-06-05 NOTE — Progress Notes (Signed)
   Subjective:    Patient ID: Cheryl Sanchez, female    DOB: 1960-12-06, 56 y.o.   MRN: [id number]  HPI 4 days ago she developed headache, malaise, fatigue that continued over the next several days followed Sunday by nausea, vomiting, fever, continued headache. Today she is again having the same thing and also feeling foggy. No sore throat, earache or nausea vomiting today. Also of note is the fact that she pulled a tick which was probably a [place] tick off of her self in mid June. It was on less than 12 hours and did not get a blood meal. She also complains of urinary frequency for the last week. She is postmenopausal. She also complains of right shoulder pain. She originally injured it approximately one year ago and then reinjured it a couple months ago while doing some lifting at work. Was followed as Workmen's Comp. but they denied coverage.  Review of Systems     Objective:   Physical Exam Alert and in no distress. Tympanic membranes and canals are normal. Pharyngeal area is normal. Neck is supple without adenopathy or thyromegaly. Cardiac exam shows a regular sinus rhythm without murmurs or gallops. Lungs are clear to auscultation. Dominant exam shows no masses or tenderness. Skin is normal. Right shoulder exam shows full motion of the shoulder with pain on abduction and external rotation. Drop arm test was negative. Supraspinatus testing was uncomfortable. Neer's and Hawkins test positive. Negative sulcus sign. Urine microscopic showed scattered red cells     Assessment & Plan:  Frequent urination - Plan: POCT Urinalysis DIP (Proadvantage Device)  Rotator cuff tendinitis, right - Plan: Ambulatory referral to Physical Therapy  Viral syndrome  I explained that I did not think she had a UTI and no intervention offered there. Recommend she treat her viral syndrome supportive Nedra HaiLee with aspirin, Advil. She will keep me informed if anything changes there. Discussed the rotator cuff  tendinitis with her. Recommended an injection which she agreed to. The shoulder was prepped and the posterolateral area. 40 mg of Kenalog and 3 mL of Xylocaine was injected in the subacromial bursa without difficulty. She tolerated the procedure well. She will be sent for physical therapy. She continues have difficulty after physical therapy I will reevaluate from there.

## 2017-06-05 NOTE — Addendum Note (Signed)
Addended by: Lavell IslamHOTON, Teja Judice M on: 06/05/2017 03:47 PM   Modules accepted: Orders

## 2017-08-04 ENCOUNTER — Ambulatory Visit (INDEPENDENT_AMBULATORY_CARE_PROVIDER_SITE_OTHER): Payer: 59 | Admitting: Medical

## 2017-08-04 ENCOUNTER — Encounter: Payer: Self-pay | Admitting: Medical

## 2017-08-04 VITALS — BP 122/76 | HR 71 | Wt 128.4 lb

## 2017-08-04 DIAGNOSIS — H01001 Unspecified blepharitis right upper eyelid: Secondary | ICD-10-CM | POA: Diagnosis not present

## 2017-08-04 MED ORDER — ERYTHROMYCIN 5 MG/GM OP OINT: 1.0000 "application " | TOPICAL_OINTMENT | Freq: Four times a day (QID) | OPHTHALMIC | 0 refills | Status: DC

## 2017-08-04 NOTE — Progress Notes (Signed)
Subjective: Chief Complaint  Patient presents with  . swelling in eye lid    swelling in eye lid , pain in eye last night   Here for c/o 1 day hx/o swelling redness and pain in right eye.  Started last night.   No crusting, no drainage, no vision changes.  She does note using an old bottle of mascara recently, but no injury, no debris in eye, no other aggravating or relieving factor.  No other URI symptoms.  No prior similar.   No other c/o.  Past Medical History:  Diagnosis Date  . Allergy   . Endometriosis    No current outpatient prescriptions on file prior to visit.   No current facility-administered medications on file prior to visit.    ROS as in subjective  Objective: BP 122/76   Pulse 71   Wt 128 lb 6.4 oz (58.2 kg)   LMP 06/09/2012   SpO2 96%   BMI 23.48 kg/m   Gen: wd, wn, nad Right upper eyelid with mild swelling and pink coloration.  No obvious foreign body, no discharge, no other erythema.  Right eye EOMi.   Rest of eye exam unremarkable General appearance: alert, no distress, WD/WN,  HEENT: normocephalic, sclerae anicteric, TMs pearly, nares patent, no discharge or erythema, pharynx normal   Assessment: Encounter Diagnosis  Name Primary?  . Blepharitis of right upper eyelid, unspecified type Yes     Plan: discussed symptoms, exam findings, and begin medication below, use warm moist compresses TID, use good handwashing and hand hygiene   If worse or not much improved within the next week, then recheck  Cheryl Sanchez was seen today for swelling in eye lid.  Diagnoses and all orders for this visit:  Blepharitis of right upper eyelid, unspecified type  Other orders -     erythromycin [facility]) ophthalmic ointment; Place 1 application into both eyes 4 (four) times daily.

## 2017-09-20 ENCOUNTER — Ambulatory Visit (INDEPENDENT_AMBULATORY_CARE_PROVIDER_SITE_OTHER): Payer: 59 | Admitting: Family Medicine

## 2017-09-20 VITALS — BP 122/80 | HR 71 | Temp 98.0°F | Wt 126.8 lb

## 2017-09-20 DIAGNOSIS — R350 Frequency of micturition: Secondary | ICD-10-CM

## 2017-09-20 DIAGNOSIS — N3000 Acute cystitis without hematuria: Secondary | ICD-10-CM

## 2017-09-20 LAB — POCT URINALYSIS DIP (PROADVANTAGE DEVICE)
BILIRUBIN UA: NEGATIVE
BILIRUBIN UA: NEGATIVE mg/dL
GLUCOSE UA: NEGATIVE mg/dL
Nitrite, UA: POSITIVE — AB
Protein Ur, POC: NEGATIVE mg/dL
SPECIFIC GRAVITY, URINE: 1.03
pH, UA: 6 (ref 5.0–8.0)

## 2017-09-20 MED ORDER — SULFAMETHOXAZOLE-TRIMETHOPRIM 800-160 MG PO TABS: 1.0000 | ORAL_TABLET | Freq: Two times a day (BID) | ORAL | 0 refills | Status: DC

## 2017-09-20 NOTE — Progress Notes (Signed)
   Subjective:    Patient ID: Cheryl Sanchez, female    DOB: Oct 25, 1961, 56 y.o.   MRN: [id number]  HPI She complains of a 2 day history of urgency and frequency. She does have an underlying history of stress incontinence and plans to discuss this with her gynecologist. He incontinence is now interfering with the quality of her life, interfering with her running.   Review of Systems     Objective:   Physical Exam Alert and in no distress. Urine dipstick was positive for nitrites and white cells.       Assessment & Plan:  Frequency of urination - Plan: POCT Urinalysis DIP (Proadvantage Device)  Acute cystitis without hematuria - Plan: sulfamethoxazole-trimethoprim (BACTRIM DS,SEPTRA DS) 800-160 MG tablet

## 2017-09-22 ENCOUNTER — Other Ambulatory Visit: Payer: Self-pay | Admitting: Medical

## 2017-09-22 ENCOUNTER — Telehealth: Payer: Self-pay | Admitting: Family Medicine

## 2017-09-22 MED ORDER — NITROFURANTOIN MONOHYD MACRO 100 MG PO CAPS: 100.0000 mg | ORAL_CAPSULE | Freq: Two times a day (BID) | ORAL | 0 refills | Status: AC

## 2017-09-22 NOTE — Telephone Encounter (Signed)
Documented in chart.

## 2017-09-22 NOTE — Telephone Encounter (Signed)
I sent Macrobid antibiotic as alternate

## 2017-09-22 NOTE — Telephone Encounter (Signed)
Informed pt that rx was sent the pharmacy

## 2017-09-22 NOTE — Telephone Encounter (Signed)
Pt called and states that the bactrim caused her to have a allergic reaction she broke out in hives, and was itching and her face was bright red and had red whelps she only took the medicine 09-20-2017, she took 2 doses, and since then she has taking benadryl, states  Her throat is still itching from it, she is wanting to know if she can get something else for her urine infection, pt can be reached at [phone] and pt uses [facility] [place] - [address] [facility] [address] pt also states her lips where burning from it, she did not want to come in for  Appt, since she was just here informed her dr Susann Givenslalonde was not in the office today

## 2017-09-22 NOTE — Telephone Encounter (Signed)
Make sure you document the allergy.

## 2017-11-23 ENCOUNTER — Encounter: Payer: Self-pay | Admitting: Medical

## 2017-11-23 ENCOUNTER — Ambulatory Visit (INDEPENDENT_AMBULATORY_CARE_PROVIDER_SITE_OTHER): Payer: Managed Care, Other (non HMO) | Admitting: Medical

## 2017-11-23 VITALS — BP 110/76 | HR 75 | Temp 98.0°F

## 2017-11-23 DIAGNOSIS — R350 Frequency of micturition: Secondary | ICD-10-CM | POA: Diagnosis not present

## 2017-11-23 DIAGNOSIS — R300 Dysuria: Secondary | ICD-10-CM

## 2017-11-23 LAB — POCT URINALYSIS DIP (PROADVANTAGE DEVICE)
BILIRUBIN UA: NEGATIVE
Blood, UA: NEGATIVE
GLUCOSE UA: NEGATIVE mg/dL
Ketones, POC UA: NEGATIVE mg/dL
Leukocytes, UA: NEGATIVE
NITRITE UA: NEGATIVE
Protein Ur, POC: NEGATIVE mg/dL
Specific Gravity, Urine: 1.015
UUROB: NEGATIVE
pH, UA: 6 (ref 5.0–8.0)

## 2017-11-23 MED ORDER — NITROFURANTOIN MONOHYD MACRO 100 MG PO CAPS: 100.0000 mg | ORAL_CAPSULE | Freq: Two times a day (BID) | ORAL | 0 refills | Status: DC

## 2017-11-23 NOTE — Progress Notes (Signed)
Subjective: Chief Complaint  Patient presents with  . possible uti    urine freq, burning with urination, bloating x 1 week   Here for possible UTI.  Has had UTI symptoms since before Christmas.   Feeling urinary frequency, incomplete bladder emptying, lower abdominal pain and bloating, back ache.   No fever.  No NVD.  No blood in urine.   No odor in urine.  Last UTI was 2 months ago.   Has appt with gynecologist next week, discussed surgery for urinary incontinence with laughing and coughing.   Married.  No vaginal discharge, no concern for STD.    Drinks 2 cups of coffee in the morning.  No recent changes in hygiene products.  No other aggravating or relieving factors. No other complaint.  Past Medical History:  Diagnosis Date  . Allergy   . Endometriosis    No current outpatient medications on file prior to visit.   No current facility-administered medications on file prior to visit.    ROS as in subjective   Objective: BP 110/76   Pulse 75   Temp 98 F (36.7 C)   LMP 06/09/2012   SpO2 97%   Gen: wd, wn, nad Abdomen: +bs,soft, mild LLQ tenderness, no other tenderness, no mass, no organomegaly Back non tender    Assessment: Encounter Diagnoses  Name Primary?  . Urine frequency Yes  . Dysuria      Plan: Discussed the normal urinalysis, possible causes of urinary symptoms.   Urine culture sent.   Begin Macrobid.  She will f/u with gynecology next week for incontinence and rule out other causes.     Gaetano HawthorneDaintry was seen today for possible uti.  Diagnoses and all orders for this visit:  Urine frequency -     POCT Urinalysis DIP (Proadvantage Device) -     Urine Culture  Dysuria -     Urine Culture  Other orders -     nitrofurantoin, macrocrystal-monohydrate, (MACROBID) 100 MG capsule; Take 1 capsule (100 mg total) by mouth 2 (two) times daily.

## 2017-11-24 LAB — URINE CULTURE
MICRO NUMBER: 90010101
Result:: NO GROWTH
SPECIMEN QUALITY: ADEQUATE

## 2018-04-15 IMAGING — CR DG SHOULDER 2+V*R*
4 series · 4 of 4 positions shown · non-contrast
Comparison: None.

CLINICAL DATA: Injury to the right shoulder, persistent pain

EXAM:
RIGHT SHOULDER - 2+ VIEW

[view not recorded (1 of 4)]
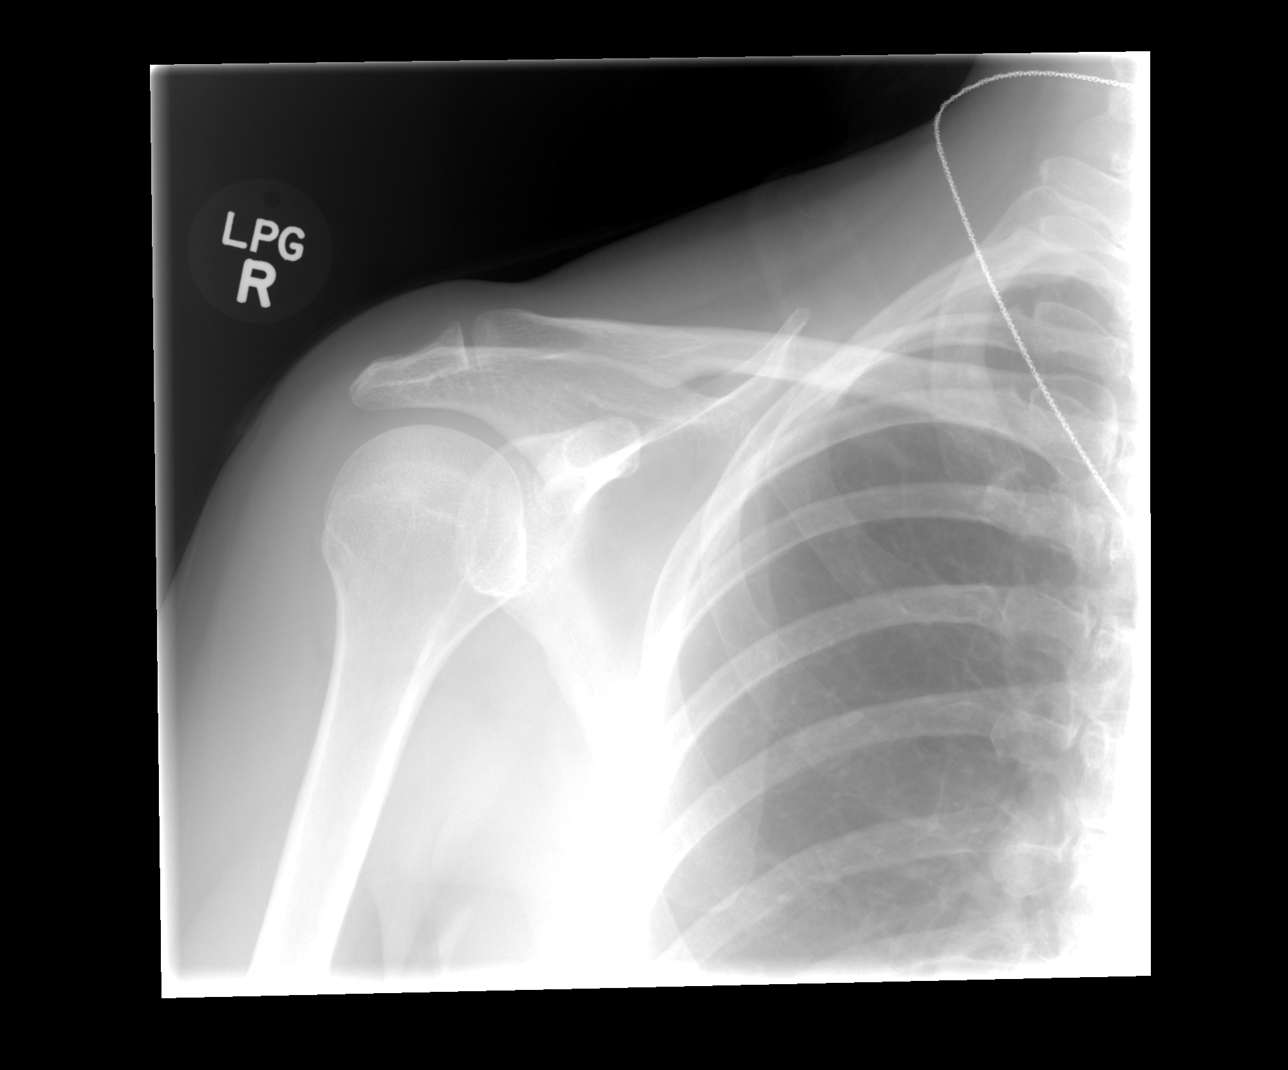

[view not recorded (2 of 4)]
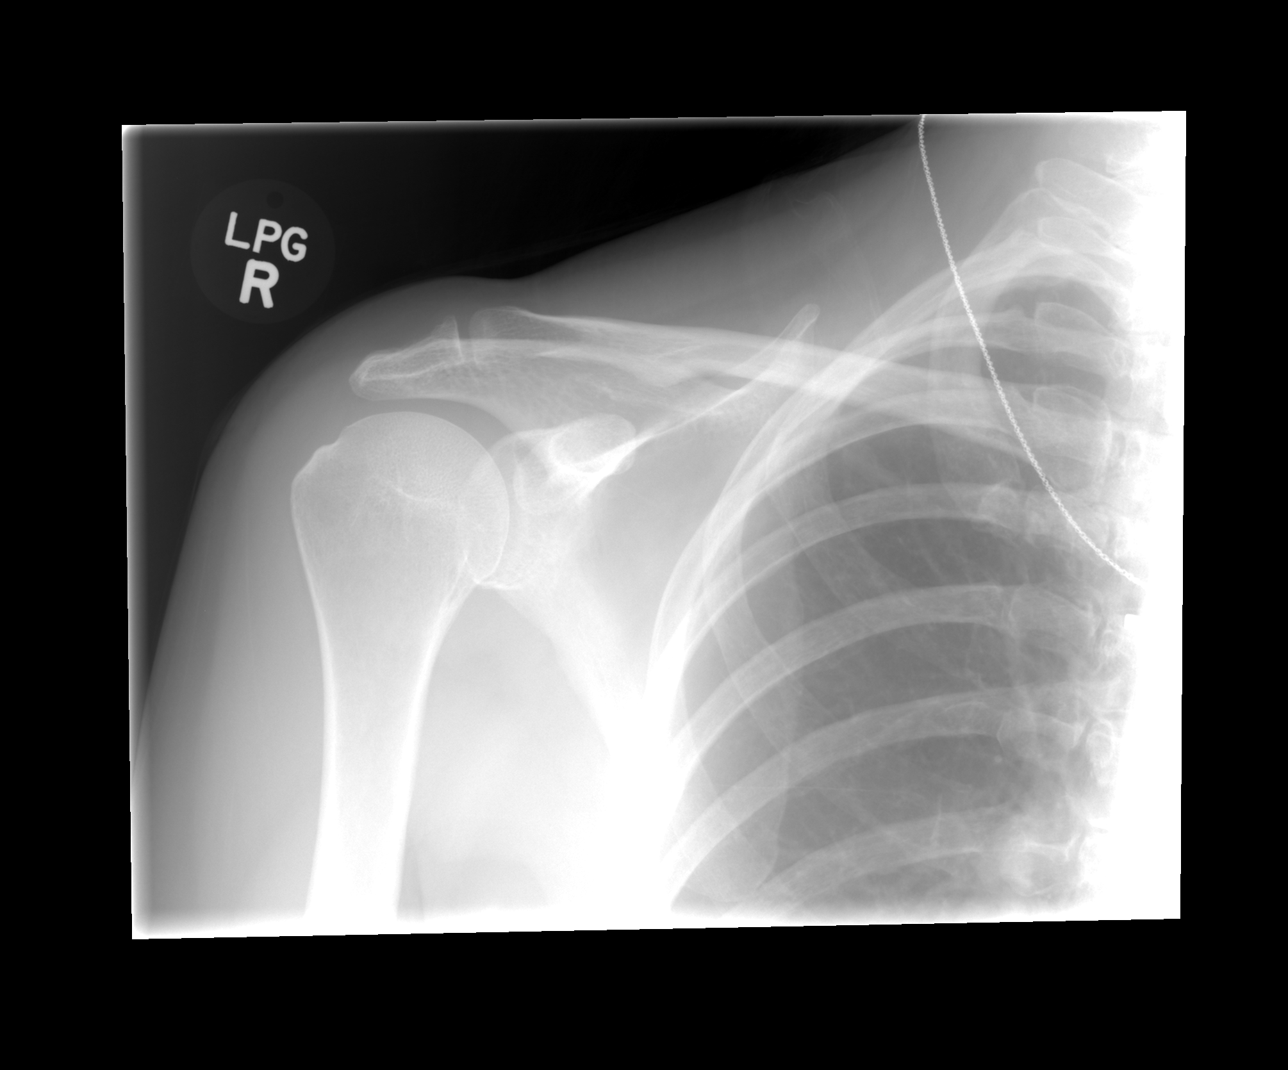

[view not recorded (3 of 4)]
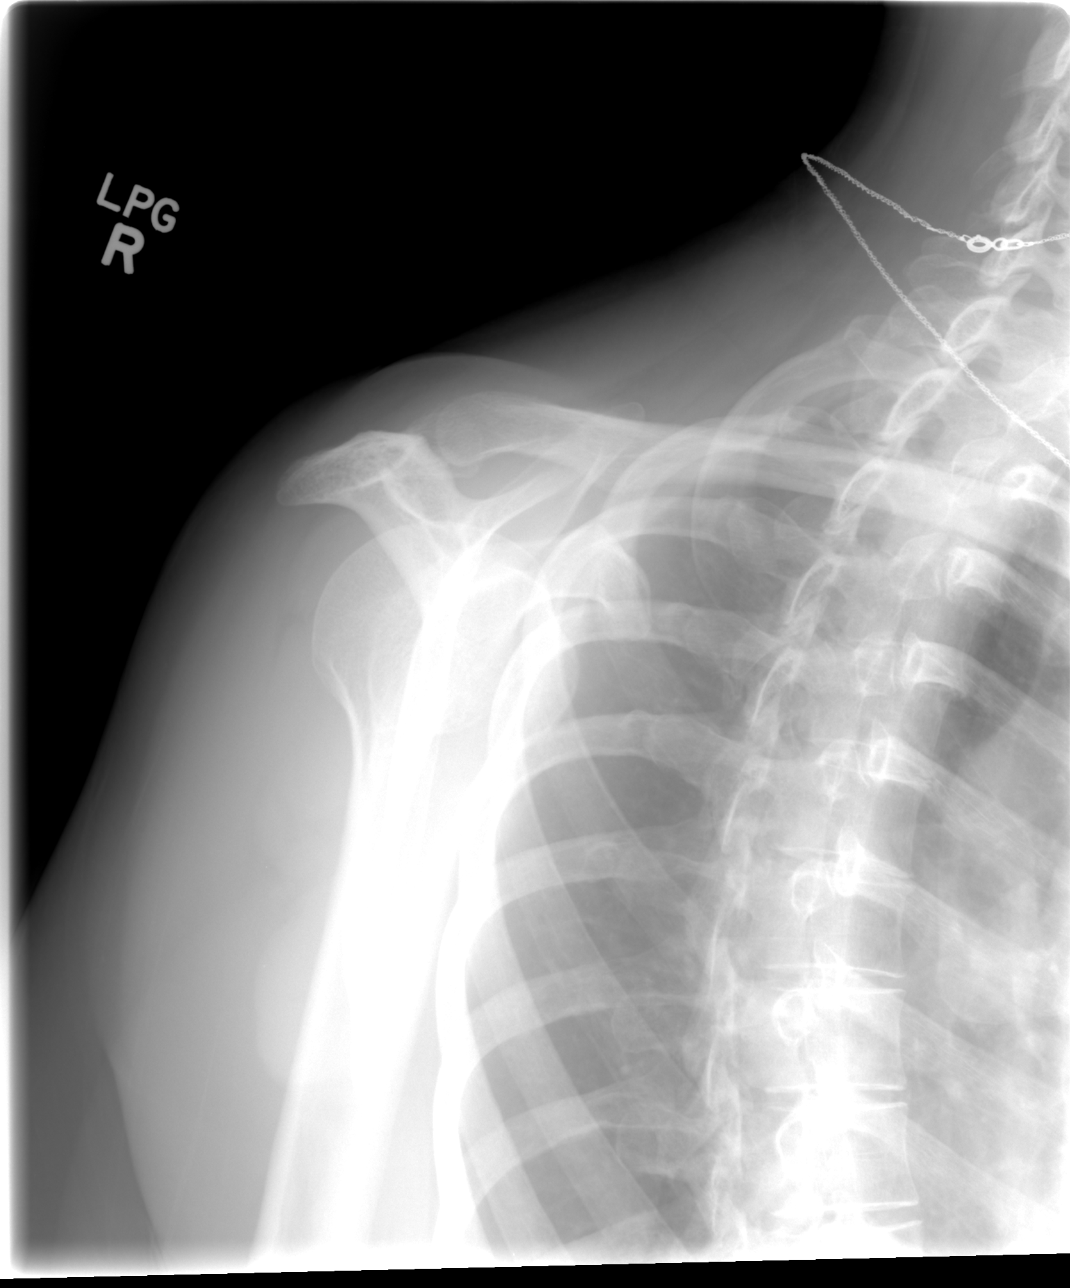

[view not recorded (4 of 4)]
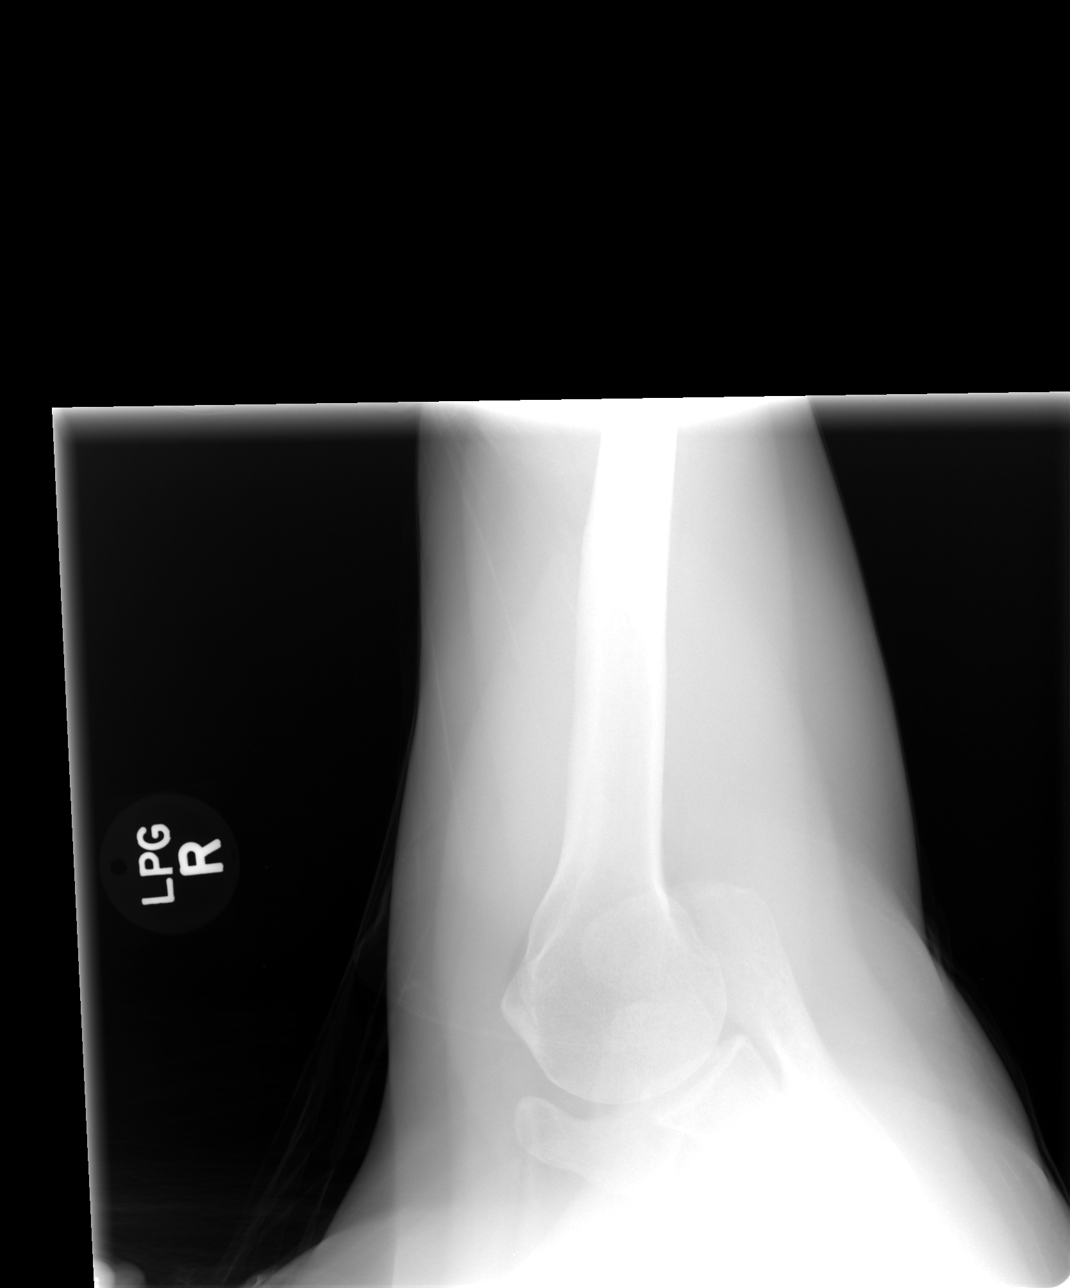

[4 of 4 positions shown; findings below may reference images not displayed]

FINDINGS: There is no evidence of fracture or dislocation. Minimal right AC
joint degenerative change. Soft tissues are unremarkable.
IMPRESSION: No acute osseous abnormality

## 2018-08-21 ENCOUNTER — Ambulatory Visit (INDEPENDENT_AMBULATORY_CARE_PROVIDER_SITE_OTHER): Payer: Managed Care, Other (non HMO) | Admitting: Family Medicine

## 2018-08-21 ENCOUNTER — Encounter: Payer: Self-pay | Admitting: Family Medicine

## 2018-08-21 VITALS — BP 126/80 | HR 74 | Temp 97.8°F | Ht 62.0 in | Wt 118.6 lb

## 2018-08-21 DIAGNOSIS — R5383 Other fatigue: Secondary | ICD-10-CM

## 2018-08-21 DIAGNOSIS — L723 Sebaceous cyst: Secondary | ICD-10-CM

## 2018-08-21 DIAGNOSIS — J309 Allergic rhinitis, unspecified: Secondary | ICD-10-CM

## 2018-08-21 LAB — COMPREHENSIVE METABOLIC PANEL
A/G RATIO: 2 (ref 1.2–2.2)
ALBUMIN: 4.5 g/dL (ref 3.5–5.5)
ALT: 14 IU/L (ref 0–32)
AST: 18 IU/L (ref 0–40)
Alkaline Phosphatase: 73 IU/L (ref 39–117)
BUN / CREAT RATIO: 24 — AB (ref 9–23)
BUN: 18 mg/dL (ref 6–24)
Bilirubin Total: 0.4 mg/dL (ref 0.0–1.2)
CALCIUM: 9.5 mg/dL (ref 8.7–10.2)
CO2: 24 mmol/L (ref 20–29)
CREATININE: 0.74 mg/dL (ref 0.57–1.00)
Chloride: 102 mmol/L (ref 96–106)
GFR, EST AFRICAN AMERICAN: 104 mL/min/{1.73_m2} (ref >59)
GFR, EST NON AFRICAN AMERICAN: 90 mL/min/{1.73_m2} (ref >59)
GLOBULIN, TOTAL: 2.2 g/dL (ref 1.5–4.5)
Glucose: 112 mg/dL — ABNORMAL HIGH (ref 65–99)
POTASSIUM: 4.4 mmol/L (ref 3.5–5.2)
Sodium: 141 mmol/L (ref 134–144)
Total Protein: 6.7 g/dL (ref 6.0–8.5)

## 2018-08-21 LAB — CBC WITH DIFFERENTIAL/PLATELET
BASOS: 0 %
Basophils Absolute: 0 10*3/uL (ref 0.0–0.2)
EOS (ABSOLUTE): 0 10*3/uL (ref 0.0–0.4)
EOS: 1 %
HEMATOCRIT: 41.2 % (ref 34.0–46.6)
HEMOGLOBIN: 13.8 g/dL (ref 11.1–15.9)
IMMATURE GRANULOCYTES: 0 %
Immature Grans (Abs): 0 10*3/uL (ref 0.0–0.1)
LYMPHS ABS: 1.2 10*3/uL (ref 0.7–3.1)
Lymphs: 29 %
MCH: 30.7 pg (ref 26.6–33.0)
MCHC: 33.5 g/dL (ref 31.5–35.7)
MCV: 92 fL (ref 79–97)
MONOCYTES: 6 %
Monocytes Absolute: 0.2 10*3/uL (ref 0.1–0.9)
NEUTROS PCT: 64 %
Neutrophils Absolute: 2.7 10*3/uL (ref 1.4–7.0)
Platelets: 291 10*3/uL (ref 150–450)
RBC: 4.49 x10E6/uL (ref 3.77–5.28)
RDW: 13.1 % (ref 12.3–15.4)
WBC: 4.1 10*3/uL (ref 3.4–10.8)

## 2018-08-21 NOTE — Progress Notes (Signed)
   Subjective:    Patient ID: Cheryl Sanchez, female    DOB: 22-Jan-1961, 57 y.o.   MRN: [id number]  HPI She complains of a 2-week history of started with fatigue, malaise, feeling of lymph node swelling, headache with increased nasal congestion and some leg aching.  She does have a history of allergies and usually uses Sudafed with good results.  Main symptoms are sinus congestion and headache.  She has had no cough, congestion, sore throat, earache, urinary symptoms or PND. She does have a lesion on the left upper back that she would like removed.  Review of Systems     Objective:   Physical Exam Alert and in no distress.  Nasal mucosa is normal.  Some tenderness palpation over frontal and maxillary sinuses.  Tympanic membranes and canals are normal. Pharyngeal area is normal. Neck is supple without adenopathy or thyromegaly. Cardiac exam shows a regular sinus rhythm without murmurs or gallops. Lungs are clear to auscultation. 2 cm round smooth lesion noted in left mid upper back area.  It does have a central dimple.     Assessment & Plan:  Fatigue, unspecified type - Plan: CBC with Differential/Platelet, Comprehensive metabolic panel  Allergic rhinitis, unspecified seasonality, unspecified trigger  Sebaceous cyst Discussed symptoms that she is having and we can potentially treated with an antibiotic for presumed sinusitis but explained that it was certainly not classic.  I will check blood work first. she is comfortable with that.   Also recommend she come back at her convenience for excision of the cyst.

## 2018-09-03 ENCOUNTER — Ambulatory Visit (INDEPENDENT_AMBULATORY_CARE_PROVIDER_SITE_OTHER): Payer: Managed Care, Other (non HMO) | Admitting: Family Medicine

## 2018-09-03 VITALS — BP 108/70 | HR 78 | Temp 97.9°F | Wt 119.2 lb

## 2018-09-03 DIAGNOSIS — L723 Sebaceous cyst: Secondary | ICD-10-CM

## 2018-09-03 NOTE — Progress Notes (Signed)
   Subjective:    Patient ID: Cheryl Sanchez, female    DOB: May 12, 1961, 57 y.o.   MRN: [id number]  HPI She has a lesion on her left upper back area that is growing and getting her some slight discomfort.  She would like it removed.   Review of Systems     Objective:   Physical Exam In 1 and half centimeter round smooth lesion with a central dimple is noted.       Assessment & Plan:  Sebaceous cyst The cyst was injected with Xylocaine and epinephrine.  A 1/2 cm incision was made.  The cyst was manually removed.  It had to be dug out as it did not come out in toto.  Explained that this could potentially recur again.  She is aware of this.

## 2018-09-04 MED ORDER — LIDOCAINE-EPINEPHRINE (PF) 1 %-1:200000 IJ SOLN: 10.0000 mL | Freq: Once | INTRAMUSCULAR | Status: AC

## 2018-09-04 NOTE — Addendum Note (Signed)
Addended by: Renelda Loma on: 09/04/2018 04:40 PM   Modules accepted: Orders

## 2018-11-07 ENCOUNTER — Encounter: Payer: Self-pay | Admitting: Family Medicine

## 2018-11-07 ENCOUNTER — Ambulatory Visit (INDEPENDENT_AMBULATORY_CARE_PROVIDER_SITE_OTHER): Payer: Managed Care, Other (non HMO) | Admitting: Family Medicine

## 2018-11-07 VITALS — BP 122/84 | HR 84 | Ht 62.0 in | Wt 116.2 lb

## 2018-11-07 DIAGNOSIS — J3489 Other specified disorders of nose and nasal sinuses: Secondary | ICD-10-CM | POA: Diagnosis not present

## 2018-11-07 DIAGNOSIS — J069 Acute upper respiratory infection, unspecified: Secondary | ICD-10-CM

## 2018-11-07 DIAGNOSIS — H8111 Benign paroxysmal vertigo, right ear: Secondary | ICD-10-CM

## 2018-11-07 NOTE — Patient Instructions (Signed)
Drink plenty of fluids Use sudafed as needed for sinus pain. Consider using mucinex also (to help loosen the mucus and alleviate the sinus pressure, if you don't want to do sinus rinses).  You seemed to have a positional component to your dizziness--worse when looking to the right. 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  This can either resolve on its own, or be managed by a physical therapist with Epley maneuver, if it isn't improving on its own. Meclizine 12.5-25mg  every 6-8 hours can be used to help with the dizziness.  This can be sedating, so use with caution.  You may be at the early onset of a bad cold, and/or sinus infection. It might get worse before it gets better (sinus pressure, etc, not necessarily the vertigo).  Contact is not improving as one would expect.     Benign Positional Vertigo Vertigo is the feeling that you or your surroundings are moving when they are not. Benign positional vertigo is the most common form of vertigo. This is usually a harmless condition (benign). This condition is positional. This means that symptoms are triggered by certain movements and positions. This condition can be dangerous if it occurs while you are doing something that could cause harm to you or others. This includes activities such as driving or operating machinery. What are the causes? In many cases, the cause of this condition is not known. It may be caused by a disturbance in an area of the inner ear that helps your brain to sense movement and balance. This disturbance can be caused by:  Viral infection (labyrinthitis).  Head injury.  Repetitive motion, such as jumping, dancing, or running. What increases the risk? You are more likely to develop this condition if:  You are a woman.  You are 57 years of age or older. What are the signs or symptoms? Symptoms of this condition usually happen when you move your head or your eyes in different  directions. Symptoms may start suddenly, and usually last for less than a minute. They include:  Loss of balance and falling.  Feeling like you are spinning or moving.  Feeling like your surroundings are spinning or moving.  Nausea and vomiting.  Blurred vision.  Dizziness.  Involuntary eye movement (nystagmus). Symptoms can be mild and cause only minor problems, or they can be severe and interfere with daily life. Episodes of benign positional vertigo may return (recur) over time. Symptoms may improve over time. How is this diagnosed? This condition may be diagnosed based on:  Your medical history.  Physical exam of the head, neck, and ears.  Tests, such as: ? MRI. ? CT scan. ? Eye movement tests. Your health care provider may ask you to change positions quickly while he or she watches you for symptoms of benign positional vertigo, such as nystagmus. Eye movement may be tested with a variety of exams that are designed to evaluate or stimulate vertigo. ? An electroencephalogram (EEG). This records electrical activity in your brain. ? Hearing tests. You may be referred to a health care provider who specializes in ear, nose, and throat (ENT) problems (otolaryngologist) or a provider who specializes in disorders of the nervous system (neurologist). How is this treated?  This condition may be treated in a session in which your health care provider moves your head in specific positions to adjust your inner ear back to normal. Treatment for this condition may take several sessions. Surgery may be needed in severe cases,  but this is rare. In some cases, benign positional vertigo may resolve on its own in 2-4 weeks. Follow these instructions at home: Safety  Move slowly. Avoid sudden body or head movements or certain positions, as told by your health care provider.  Avoid driving until your health care provider says it is safe for you to do so.  Avoid operating heavy machinery until  your health care provider says it is safe for you to do so.  Avoid doing any tasks that would be dangerous to you or others if vertigo occurs.  If you have trouble walking or keeping your balance, try using a cane for stability. If you feel dizzy or unstable, sit down right away.  Return to your normal activities as told by your health care provider. Ask your health care provider what activities are safe for you. General instructions  Take over-the-counter and prescription medicines only as told by your health care provider.  Drink enough fluid to keep your urine pale yellow.  Keep all follow-up visits as told by your health care provider. This is important. Contact a health care provider if:  You have a fever.  Your condition gets worse or you develop new symptoms.  Your family or friends notice any behavioral changes.  You have nausea or vomiting that gets worse.  You have numbness or a "pins and needles" sensation. Get help right away if you:  Have difficulty speaking or moving.  Are always dizzy.  Faint.  Develop severe headaches.  Have weakness in your legs or arms.  Have changes in your hearing or vision.  Develop a stiff neck.  Develop sensitivity to light. Summary  Vertigo is the feeling that you or your surroundings are moving when they are not. Benign positional vertigo is the most common form of vertigo.  The cause of this condition is not known. It may be caused by a disturbance in an area of the inner ear that helps your brain to sense movement and balance.  Symptoms include loss of balance and falling, feeling that you or your surroundings are moving, nausea and vomiting, and blurred vision.  This condition can be diagnosed based on symptoms, physical exam, and other tests, such as MRI, CT scan, eye movement tests, and hearing tests.  Follow safety instructions as told by your health care provider. You will also be told when to contact your health  care provider in case of problems. This information is not intended to replace advice given to you by your health care provider. Make sure you discuss any questions you have with your health care provider. Document Released: 08/15/2006 Document Revised: 04/18/2018 Document Reviewed: 04/18/2018 [employer] Interactive Patient Education  2019 [employer]
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
## 2018-11-16 ENCOUNTER — Telehealth: Payer: Self-pay | Admitting: Internal Medicine

## 2018-11-16 DIAGNOSIS — H8111 Benign paroxysmal vertigo, right ear: Secondary | ICD-10-CM

## 2018-11-16 NOTE — Telephone Encounter (Signed)
Pt  Called and states that she is not any better. Meclizine has helped only to delay the dizziness but not get rid of it. Pt wants to know what the next step is

## 2018-11-16 NOTE — Telephone Encounter (Signed)
As we discussed at her visit, next step is PT referral (neuro rehab), for treatment of BPV.  Please advise pt and do referral

## 2018-11-16 NOTE — Telephone Encounter (Signed)
Pt was notified and I have put referral into neuro rehab

## 2020-04-16 DIAGNOSIS — H43812 Vitreous degeneration, left eye: Secondary | ICD-10-CM | POA: Diagnosis not present

## 2020-06-17 ENCOUNTER — Ambulatory Visit: Payer: BC Managed Care – PPO | Attending: Internal Medicine

## 2020-06-17 DIAGNOSIS — Z20822 Contact with and (suspected) exposure to covid-19: Secondary | ICD-10-CM | POA: Insufficient documentation

## 2020-06-18 LAB — NOVEL CORONAVIRUS, NAA: SARS-CoV-2, NAA: NOT DETECTED

## 2020-06-18 LAB — SARS-COV-2, NAA 2 DAY TAT

## 2020-09-23 DIAGNOSIS — Z23 Encounter for immunization: Secondary | ICD-10-CM | POA: Diagnosis not present

## 2020-10-24 DIAGNOSIS — Z23 Encounter for immunization: Secondary | ICD-10-CM | POA: Diagnosis not present

## 2021-01-13 ENCOUNTER — Other Ambulatory Visit: Payer: Self-pay

## 2021-01-13 ENCOUNTER — Encounter: Payer: Self-pay | Admitting: Family Medicine

## 2021-01-13 ENCOUNTER — Ambulatory Visit: Payer: BC Managed Care – PPO | Admitting: Family Medicine

## 2021-01-13 VITALS — BP 142/86 | HR 83 | Temp 97.3°F | Ht 62.0 in | Wt 121.6 lb

## 2021-01-13 DIAGNOSIS — R1032 Left lower quadrant pain: Secondary | ICD-10-CM

## 2021-01-13 LAB — POCT URINALYSIS DIPSTICK
Appearance: NORMAL
Bilirubin, UA: NEGATIVE
Glucose, UA: NEGATIVE
Ketones, UA: NEGATIVE
Leukocytes, UA: NEGATIVE
Nitrite, UA: NEGATIVE
Odor: NEGATIVE
Protein, UA: NEGATIVE
Spec Grav, UA: >=1.03 — AB (ref 1.010–1.025)
Urobilinogen, UA: 0.2 E.U./dL
pH, UA: 6 (ref 5.0–8.0)

## 2021-01-13 NOTE — Progress Notes (Signed)
   Subjective:    Patient ID: Cheryl Sanchez, female    DOB: 1961-05-14, 60 y.o.   MRN: [id number]  HPI She states that she has a little over a month history of left lower quadrant discomfort and occasionally also noted on the right lower quadrant.  It is intermittent in nature but no associated nausea, vomiting, fever, chills, urinary or bowel movement symptoms.  She is seen no blood.  No relation to eating.  She has had a colonoscopy in the past but is not sure when she had it.  She states that taking ibuprofen usually takes the symptoms away.   Review of Systems     Objective:   Physical Exam Alert and in no distress.  Abdominal exam shows decreased bowel sounds with tenderness in the left lower quadrant but no rebound.  Pelvic exam shows no masses or tenderness.  Urinalysis is negative.       Assessment & Plan:  Left lower quadrant pain - Plan: Urinalysis Dipstick I explained that her symptoms could possibly represent low-grade diverticulitis.  At this point since she is not really having any more trouble with it, continue with Tylenol as appropriate.  If the pain gets much worse, recommend she call me for possibly being placed on an antibiotic and getting CT scans.  She was comfortable with that.

## 2021-05-14 DIAGNOSIS — Z01419 Encounter for gynecological examination (general) (routine) without abnormal findings: Secondary | ICD-10-CM | POA: Diagnosis not present

## 2021-05-14 DIAGNOSIS — Z6821 Body mass index (BMI) 21.0-21.9, adult: Secondary | ICD-10-CM | POA: Diagnosis not present

## 2021-05-14 LAB — HM PAP SMEAR

## 2021-05-14 LAB — RESULTS CONSOLE HPV: CHL HPV: NEGATIVE

## 2021-06-16 DIAGNOSIS — Z1231 Encounter for screening mammogram for malignant neoplasm of breast: Secondary | ICD-10-CM | POA: Diagnosis not present

## 2021-06-16 DIAGNOSIS — Z13820 Encounter for screening for osteoporosis: Secondary | ICD-10-CM | POA: Diagnosis not present

## 2021-06-16 LAB — HM MAMMOGRAPHY

## 2021-08-29 DIAGNOSIS — J069 Acute upper respiratory infection, unspecified: Secondary | ICD-10-CM | POA: Diagnosis not present

## 2021-09-13 DIAGNOSIS — Z23 Encounter for immunization: Secondary | ICD-10-CM | POA: Diagnosis not present

## 2022-03-09 DIAGNOSIS — G4719 Other hypersomnia: Secondary | ICD-10-CM | POA: Diagnosis not present

## 2022-03-10 DIAGNOSIS — G4719 Other hypersomnia: Secondary | ICD-10-CM | POA: Diagnosis not present

## 2022-03-22 DIAGNOSIS — G4733 Obstructive sleep apnea (adult) (pediatric): Secondary | ICD-10-CM | POA: Diagnosis not present

## 2022-03-29 ENCOUNTER — Encounter: Payer: Self-pay | Admitting: Physician Assistant

## 2022-03-29 ENCOUNTER — Ambulatory Visit: Payer: BC Managed Care – PPO | Admitting: Physician Assistant

## 2022-03-29 VITALS — BP 130/80 | HR 76 | Ht 62.0 in | Wt 121.4 lb

## 2022-03-29 DIAGNOSIS — R300 Dysuria: Secondary | ICD-10-CM

## 2022-03-29 DIAGNOSIS — N3001 Acute cystitis with hematuria: Secondary | ICD-10-CM

## 2022-03-29 LAB — POCT URINALYSIS DIP (CLINITEK)
Glucose, UA: NEGATIVE mg/dL
Ketones, POC UA: NEGATIVE mg/dL
Nitrite, UA: POSITIVE — AB
POC PROTEIN,UA: NEGATIVE
Spec Grav, UA: 1.01 (ref 1.010–1.025)
Urobilinogen, UA: 0.2 E.U./dL
pH, UA: 6 (ref 5.0–8.0)

## 2022-03-29 MED ORDER — CIPROFLOXACIN HCL 500 MG PO TABS: 500.0000 mg | ORAL_TABLET | Freq: Two times a day (BID) | ORAL | 0 refills | Status: AC

## 2022-03-29 NOTE — Patient Instructions (Signed)
Drink about 64 ounces of water a day, avoid or limit caffeine, take OTC cranberry supplements which support bladder health, eat a low sugar diet, blueberries are low in sugar and also support bladder health, after urinating always wipe front to back, urinate after sex, limit sitting in a tub to take baths  ?

## 2022-03-29 NOTE — Progress Notes (Signed)
dysuria ? ?Acute Office Visit ? ?Subjective:  ? ? Patient ID: Cheryl Sanchez, female    DOB: 05/04/1961, 61 y.o.   MRN: [id number] ? ?Chief Complaint  ?Patient presents with  ? Acute Visit  ?  Possible Uti, pain with urination, back pain  ? ? ?HPI ?Patient is in today for pain with urination and low back pain for a few weeks; denies history of kidney stone; denies fall or injury; denies recent swimming or taking a bath in a tub; denies fever/chills, nausea/vomiting, diarrhea/constipation. ? ? ?Outpatient Medications Prior to Visit  ?Medication Sig Dispense Refill  ? acetaminophen (TYLENOL) 500 MG tablet Take 500 mg by mouth every 6 (six) hours as needed.    ? cetirizine (ZYRTEC) 5 MG tablet Take by mouth.    ? ibuprofen (ADVIL,MOTRIN) 200 MG tablet Take 200 mg by mouth every 6 (six) hours as needed.    ? loratadine (CLARITIN) 10 MG tablet Take 10 mg by mouth daily. (Patient not taking: Reported on 01/13/2021)    ? ?Facility-Administered Medications Prior to Visit  ?Medication Dose Route Frequency Provider Last Rate Last Admin  ? lidocaine-EPINEPHrine (XYLOCAINE-EPINEPHrine) 1 %-1:200000 (PF) injection 10 mL  10 mL Intradermal Once Denita Lung, MD      ? ? ?Allergies  ?Allergen Reactions  ? Bactrim [Sulfamethoxazole-Trimethoprim]   ? ? ?Review of Systems  ?Constitutional:  Negative for activity change and chills.  ?HENT:  Negative for congestion and voice change.   ?Eyes:  Negative for pain and redness.  ?Respiratory:  Negative for cough and wheezing.   ?Cardiovascular:  Negative for chest pain.  ?Gastrointestinal:  Negative for constipation, diarrhea, nausea and vomiting.  ?Endocrine: Negative for polyuria.  ?Genitourinary:  Positive for dysuria and frequency.  ?Skin:  Negative for color change and rash.  ?Allergic/Immunologic: Negative for immunocompromised state.  ?Neurological:  Negative for dizziness.  ?Psychiatric/Behavioral:  Negative for agitation.   ? ?   ?Objective:  ?  ?Physical Exam ?Vitals and  nursing note reviewed.  ?Constitutional:   ?   General: She is not in acute distress. ?   Appearance: Normal appearance. She is not ill-appearing.  ?HENT:  ?   Head: Normocephalic and atraumatic.  ?   Right Ear: External ear normal.  ?   Left Ear: External ear normal.  ?   Nose: No congestion.  ?Eyes:  ?   Extraocular Movements: Extraocular movements intact.  ?   Conjunctiva/sclera: Conjunctivae normal.  ?   Pupils: Pupils are equal, round, and reactive to light.  ?Cardiovascular:  ?   Rate and Rhythm: Normal rate and regular rhythm.  ?   Pulses: Normal pulses.  ?   Heart sounds: Normal heart sounds.  ?Pulmonary:  ?   Effort: Pulmonary effort is normal.  ?   Breath sounds: Normal breath sounds. No wheezing.  ?Abdominal:  ?   General: Bowel sounds are normal.  ?   Palpations: Abdomen is soft.  ?Musculoskeletal:     ?   General: Normal range of motion.  ?   Cervical back: Normal range of motion and neck supple.  ?   Right lower leg: No edema.  ?   Left lower leg: No edema.  ?Skin: ?   General: Skin is warm and dry.  ?   Findings: No bruising.  ?Neurological:  ?   General: No focal deficit present.  ?   Mental Status: She is alert and oriented to person, place, and time.  ?Psychiatric:     ?  Mood and Affect: Mood normal.     ?   Behavior: Behavior normal.     ?   Thought Content: Thought content normal.  ? ? ?BP 130/80   Pulse 76   Wt 121 lb 6.4 oz (55.1 kg)   LMP 06/09/2012   SpO2 94%   BMI 22.20 kg/m?  ? ?Wt Readings from Last 3 Encounters:  ?03/29/22 121 lb 6.4 oz (55.1 kg)  ?01/13/21 121 lb 9.6 oz (55.2 kg)  ?11/07/18 116 lb 3.2 oz (52.7 kg)  ? ? ?Results for orders placed or performed in visit on 03/29/22  ?POCT URINALYSIS DIP (CLINITEK)  ?Result Value Ref Range  ? Color, UA orange (A) yellow  ? Clarity, UA clear clear  ? Glucose, UA negative negative mg/dL  ? Bilirubin, UA moderate (A) negative  ? Ketones, POC UA negative negative mg/dL  ? Spec Grav, UA 1.010 1.010 - 1.025  ? Blood, UA trace-intact (A)  negative  ? pH, UA 6.0 5.0 - 8.0  ? POC PROTEIN,UA negative negative, trace  ? Urobilinogen, UA 0.2 0.2 or 1.0 E.U./dL  ? Nitrite, UA Positive (A) Negative  ? Leukocytes, UA Small (1+) (A) Negative  ? ? ?   ?Assessment & Plan:  ?1. Dysuria ?- POCT URINALYSIS DIP (CLINITEK) ?- Urine Culture ? ?2. Acute cystitis with hematuria ?- Urine Culture ?- Drink about 64 ounces of water a day, avoid or limit caffeine, take OTC cranberry supplements which support bladder health, eat a low sugar diet, blueberries are low in sugar and also support bladder health, after urinating always wipe front to back, urinate after sex, limit sitting in a tub to take baths ? ? ? ? ?Meds ordered this encounter  ?Medications  ? ciprofloxacin (CIPRO) 500 MG tablet  ?  Sig: Take 1 tablet (500 mg total) by mouth 2 (two) times daily for 5 days.  ?  Dispense:  10 tablet  ?  Refill:  0  ?  Order Specific Question:   Supervising Provider  ?  Answer:   Denita Lung [id number]  ? ? ?Return for Return for Annual Exam with PCP [facility]. ? ?Irene Pap, PA-C ?

## 2022-03-30 DIAGNOSIS — G4733 Obstructive sleep apnea (adult) (pediatric): Secondary | ICD-10-CM | POA: Insufficient documentation

## 2022-04-02 LAB — URINE CULTURE

## 2022-04-19 DIAGNOSIS — G4733 Obstructive sleep apnea (adult) (pediatric): Secondary | ICD-10-CM | POA: Diagnosis not present

## 2022-05-10 DIAGNOSIS — G4733 Obstructive sleep apnea (adult) (pediatric): Secondary | ICD-10-CM | POA: Diagnosis not present

## 2022-05-25 ENCOUNTER — Ambulatory Visit (INDEPENDENT_AMBULATORY_CARE_PROVIDER_SITE_OTHER): Payer: BC Managed Care – PPO | Admitting: Family Medicine

## 2022-05-25 VITALS — BP 100/60 | HR 78 | Temp 98.2°F | Ht 61.54 in | Wt 122.8 lb

## 2022-05-25 DIAGNOSIS — Z1159 Encounter for screening for other viral diseases: Secondary | ICD-10-CM

## 2022-05-25 DIAGNOSIS — Z0000 Encounter for general adult medical examination without abnormal findings: Secondary | ICD-10-CM

## 2022-05-25 DIAGNOSIS — Z136 Encounter for screening for cardiovascular disorders: Secondary | ICD-10-CM | POA: Diagnosis not present

## 2022-05-25 DIAGNOSIS — Z1211 Encounter for screening for malignant neoplasm of colon: Secondary | ICD-10-CM

## 2022-05-25 DIAGNOSIS — Z23 Encounter for immunization: Secondary | ICD-10-CM | POA: Diagnosis not present

## 2022-05-25 DIAGNOSIS — J309 Allergic rhinitis, unspecified: Secondary | ICD-10-CM | POA: Insufficient documentation

## 2022-05-25 DIAGNOSIS — G4733 Obstructive sleep apnea (adult) (pediatric): Secondary | ICD-10-CM

## 2022-05-25 DIAGNOSIS — K5790 Diverticulosis of intestine, part unspecified, without perforation or abscess without bleeding: Secondary | ICD-10-CM

## 2022-05-25 NOTE — Patient Instructions (Signed)

## 2022-05-25 NOTE — Progress Notes (Signed)
Complete physical exam  Patient: Cheryl Sanchez   DOB: 07-12-1961   61 y.o. Female  MRN: [id number]  Subjective:    Chief Complaint  Patient presents with   Annual Exam    Non fasting    Cheryl Sanchez is a 61 y.o. female who presents today for a complete physical exam. She reports consuming a general diet. Home exercise routine includes calisthenics and yoga. She generally feels well. She reports sleeping fairly well. She does not have additional problems to discuss today.  She has had previous colonoscopy which did show evidence of diverticulosis.  She is at the 10-year timeframe.  She does have underlying allergies and they are causing no difficulty.  She does have a history of OSA and presently has a dental appliance which seems to be working.  She is in the process of having that more fully evaluated.  Otherwise her family and social history as well as health maintenance and immunizations was reviewed   Most recent fall risk assessment:    05/25/2022    2:52 PM  Fall Risk   Falls in the past year? 0  Number falls in past yr: 0  Injury with Fall? 0  Risk for fall due to : No Fall Risks  Follow up Falls evaluation completed     Most recent depression screenings:    05/25/2022    2:52 PM 01/13/2021    3:22 PM  PHQ 2/9 Scores  PHQ - 2 Score 0 0      Patient Active Problem List   Diagnosis Date Noted   Diverticulosis 05/25/2022   Allergic rhinitis 05/25/2022   OSA (obstructive sleep apnea) 03/30/2022      Patient Care Team: Ronnald Nian, MD as PCP - General (Family Medicine)   Outpatient Medications Prior to Visit  Medication Sig Note   acetaminophen (TYLENOL) 500 MG tablet Take 500 mg by mouth every 6 (six) hours as needed. 05/25/2022: Prn last dose yesterday     cetirizine (ZYRTEC) 5 MG tablet Take by mouth.    ibuprofen (ADVIL,MOTRIN) 200 MG tablet Take 200 mg by mouth every 6 (six) hours as needed. 05/25/2022: Prn last dose a few days ago   [DISCONTINUED]  loratadine (CLARITIN) 10 MG tablet Take 10 mg by mouth daily. (Patient not taking: Reported on 01/13/2021)    Facility-Administered Medications Prior to Visit  Medication Dose Route Frequency Provider   lidocaine-EPINEPHrine (XYLOCAINE-EPINEPHrine) 1 %-1:200000 (PF) injection 10 mL  10 mL Intradermal Once Ronnald Nian, MD    Review of Systems  All other systems reviewed and are negative.         Objective:     BP 100/60   Pulse 78   Temp 98.2 F (36.8 C)   Ht 5' 1.54" (1.563 m)   Wt 122 lb 12.8 oz (55.7 kg)   LMP 06/09/2012   SpO2 96%   BMI 22.80 kg/m  BP Readings from Last 3 Encounters:  05/25/22 100/60  03/29/22 130/80  01/13/21 (!) 142/86   Wt Readings from Last 3 Encounters:  05/25/22 122 lb 12.8 oz (55.7 kg)  03/29/22 121 lb 6.4 oz (55.1 kg)  01/13/21 121 lb 9.6 oz (55.2 kg)      Physical Exam  Alert and in no distress. Tympanic membranes and canals are normal. Pharyngeal area is normal. Neck is supple without adenopathy or thyromegaly. Cardiac exam shows a regular sinus rhythm without murmurs or gallops. Lungs are clear to auscultation.  Last CBC  Lab Results  Component Value Date   WBC 4.1 08/21/2018   HGB 13.8 08/21/2018   HCT 41.2 08/21/2018   MCV 92 08/21/2018   MCH 30.7 08/21/2018   RDW 13.1 08/21/2018   PLT 291 08/21/2018   Last metabolic panel Lab Results  Component Value Date   GLUCOSE 112 (H) 08/21/2018   NA 141 08/21/2018   K 4.4 08/21/2018   CL 102 08/21/2018   CO2 24 08/21/2018   BUN 18 08/21/2018   CREATININE 0.74 08/21/2018   GFRNONAA 90 08/21/2018   CALCIUM 9.5 08/21/2018   PROT 6.7 08/21/2018   ALBUMIN 4.5 08/21/2018   LABGLOB 2.2 08/21/2018   AGRATIO 2.0 08/21/2018   BILITOT 0.4 08/21/2018   ALKPHOS 73 08/21/2018   AST 18 08/21/2018   ALT 14 08/21/2018   Last lipids No results found for: "CHOL", "HDL", "LDLCALC", "LDLDIRECT", "TRIG", "CHOLHDL"      Assessment & Plan:    Routine general medical examination at a  health care facility - Plan: CBC with Differential/Platelet, Comprehensive metabolic panel, Lipid panel  OSA (obstructive sleep apnea)  Need for hepatitis C screening test - Plan: Hepatitis C antibody  Need for shingles vaccine - Plan: Varicella-zoster vaccine IM  Screening for colon cancer - Plan: Cologuard  Diverticulosis  Allergic rhinitis, unspecified seasonality, unspecified trigger  Immunization History  Administered Date(s) Administered   Influenza Split 09/11/2012, 09/13/2021   Influenza-Unspecified 09/18/2017   PFIZER(Purple Top)SARS-COV-2 Vaccination 01/30/2020, 02/20/2020, 10/24/2020   Pfizer Covid Bivalent Pediatric Vaccine(57mos to <62yrs) 09/13/2021   Tdap 12/05/2011    Health Maintenance  Topic Date Due   Hepatitis C Screening  Never done   COLONOSCOPY (Pts 45-91yrs Insurance coverage will need to be confirmed)  Never done   Zoster Vaccines- Shingrix (1 of 2) Never done   TETANUS/TDAP  12/04/2021   HIV Screening  05/26/2023 (Originally 03/08/1976)   INFLUENZA VACCINE  06/21/2022   MAMMOGRAM  06/17/2023   PAP SMEAR-Modifier  05/14/2026   COVID-19 Vaccine  Completed   HPV VACCINES  Aged Out    Discussed health benefits of physical activity, and encouraged her to engage in regular exercise appropriate for her age and condition.  She will continue with the dental appliance since it seems to be working.  Discussed diverticulosis and the likelihood of diverticulitis with her.  She will keep me informed since she does remember the symptoms since previously having 1 episode of diverticulitis.  Problem List Items Addressed This Visit     Allergic rhinitis   Diverticulosis   OSA (obstructive sleep apnea)   Other Visit Diagnoses     Routine general medical examination at a health care facility    -  Primary   Relevant Orders   CBC with Differential/Platelet   Comprehensive metabolic panel   Lipid panel   Need for hepatitis C screening test       Relevant Orders    Hepatitis C antibody   Need for shingles vaccine       Relevant Orders   Varicella-zoster vaccine IM   Screening for colon cancer       Relevant Orders   Cologuard      Return in about 1 year (around 05/26/2023) for fasting CPE .     Sharlot Gowda, MD

## 2022-05-26 LAB — COMPREHENSIVE METABOLIC PANEL
ALT: 19 IU/L (ref 0–32)
AST: 22 IU/L (ref 0–40)
Albumin/Globulin Ratio: 2.2 (ref 1.2–2.2)
Albumin: 4.7 g/dL (ref 3.8–4.8)
Alkaline Phosphatase: 80 IU/L (ref 44–121)
BUN/Creatinine Ratio: 20 (ref 12–28)
BUN: 15 mg/dL (ref 8–27)
Bilirubin Total: 0.2 mg/dL (ref 0.0–1.2)
CO2: 22 mmol/L (ref 20–29)
Calcium: 9.7 mg/dL (ref 8.7–10.3)
Chloride: 104 mmol/L (ref 96–106)
Creatinine, Ser: 0.74 mg/dL (ref 0.57–1.00)
Globulin, Total: 2.1 g/dL (ref 1.5–4.5)
Glucose: 99 mg/dL (ref 70–99)
Potassium: 4.4 mmol/L (ref 3.5–5.2)
Sodium: 141 mmol/L (ref 134–144)
Total Protein: 6.8 g/dL (ref 6.0–8.5)
eGFR: 92 mL/min/{1.73_m2} (ref >59)

## 2022-05-26 LAB — CBC WITH DIFFERENTIAL/PLATELET
Basophils Absolute: 0 10*3/uL (ref 0.0–0.2)
Basos: 0 %
EOS (ABSOLUTE): 0.1 10*3/uL (ref 0.0–0.4)
Eos: 1 %
Hematocrit: 42.1 % (ref 34.0–46.6)
Hemoglobin: 14.3 g/dL (ref 11.1–15.9)
Immature Grans (Abs): 0 10*3/uL (ref 0.0–0.1)
Immature Granulocytes: 0 %
Lymphocytes Absolute: 1.4 10*3/uL (ref 0.7–3.1)
Lymphs: 23 %
MCH: 31 pg (ref 26.6–33.0)
MCHC: 34 g/dL (ref 31.5–35.7)
MCV: 91 fL (ref 79–97)
Monocytes Absolute: 0.4 10*3/uL (ref 0.1–0.9)
Monocytes: 7 %
Neutrophils Absolute: 4.3 10*3/uL (ref 1.4–7.0)
Neutrophils: 69 %
Platelets: 272 10*3/uL (ref 150–450)
RBC: 4.61 x10E6/uL (ref 3.77–5.28)
RDW: 12.6 % (ref 11.7–15.4)
WBC: 6.2 10*3/uL (ref 3.4–10.8)

## 2022-05-26 LAB — LIPID PANEL
Chol/HDL Ratio: 3.7 ratio (ref 0.0–4.4)
Cholesterol, Total: 227 mg/dL — ABNORMAL HIGH (ref 100–199)
HDL: 62 mg/dL (ref >39)
LDL Chol Calc (NIH): 132 mg/dL — ABNORMAL HIGH (ref 0–99)
Triglycerides: 187 mg/dL — ABNORMAL HIGH (ref 0–149)
VLDL Cholesterol Cal: 33 mg/dL (ref 5–40)

## 2022-05-26 LAB — HEPATITIS C ANTIBODY: Hep C Virus Ab: NONREACTIVE

## 2022-06-09 DIAGNOSIS — G4733 Obstructive sleep apnea (adult) (pediatric): Secondary | ICD-10-CM | POA: Diagnosis not present

## 2022-06-13 DIAGNOSIS — Z1211 Encounter for screening for malignant neoplasm of colon: Secondary | ICD-10-CM | POA: Diagnosis not present

## 2022-06-20 LAB — COLOGUARD: COLOGUARD: NEGATIVE

## 2022-07-06 ENCOUNTER — Other Ambulatory Visit: Payer: BC Managed Care – PPO

## 2022-07-27 ENCOUNTER — Encounter: Payer: Self-pay | Admitting: Internal Medicine

## 2022-09-12 ENCOUNTER — Encounter: Payer: Self-pay | Admitting: Internal Medicine

## 2022-11-16 ENCOUNTER — Other Ambulatory Visit: Payer: BC Managed Care – PPO

## 2023-01-01 ENCOUNTER — Other Ambulatory Visit: Payer: Self-pay | Admitting: Family Medicine

## 2023-01-01 MED ORDER — NITROFURANTOIN MONOHYD MACRO 100 MG PO CAPS: 100.0000 mg | ORAL_CAPSULE | Freq: Two times a day (BID) | ORAL | 0 refills | Status: DC

## 2023-01-01 NOTE — Progress Notes (Signed)
She has a 1 week history of frequency, urgency and dysuria.  She has had this in the past.  I will call in [place].

## 2023-01-13 ENCOUNTER — Ambulatory Visit: Payer: 59 | Admitting: Nurse Practitioner

## 2023-01-13 ENCOUNTER — Encounter: Payer: Self-pay | Admitting: Nurse Practitioner

## 2023-01-13 VITALS — BP 128/82 | HR 93 | Wt 126.4 lb

## 2023-01-13 DIAGNOSIS — N3090 Cystitis, unspecified without hematuria: Secondary | ICD-10-CM | POA: Insufficient documentation

## 2023-01-13 DIAGNOSIS — R350 Frequency of micturition: Secondary | ICD-10-CM | POA: Diagnosis not present

## 2023-01-13 LAB — POCT URINALYSIS DIP (CLINITEK)
Glucose, UA: NEGATIVE mg/dL
Ketones, POC UA: NEGATIVE mg/dL
Nitrite, UA: POSITIVE — AB
POC PROTEIN,UA: NEGATIVE
Spec Grav, UA: 1.02 (ref 1.010–1.025)
Urobilinogen, UA: 0.2 E.U./dL
pH, UA: 6 (ref 5.0–8.0)

## 2023-01-13 MED ORDER — CIPROFLOXACIN HCL 500 MG PO TABS: 500.0000 mg | ORAL_TABLET | Freq: Two times a day (BID) | ORAL | 0 refills | Status: AC

## 2023-01-13 NOTE — Assessment & Plan Note (Signed)
The patient's urine test results indicate an ongoing infection, suggesting that the initial treatment with Nitrofurantoin (Macrobid) was ineffective. Considering this, we need to explore alternative antibiotic options. Consider possible vaginal atrophy associated with menopause as causation for increased UTI.   Plan: - Prescribe Ciprofloxacin for a six-day course, with instructions to discontinue after three days if symptoms resolve completely. - Monitor the patient's response to the new medication and watch for signs of recurrence or complications. - If UTIs become recurrent, evaluate the need for prophylactic measures, such as low-dose Macrobid or post-coital dosing. - Urine sent for culture to ensure appropriate therapy has been selected.  - Recommend using aquaphor to vaginal introitus and labia to help with atrophy of the tissue in effort to help reduce urinary symptoms.

## 2023-01-13 NOTE — Progress Notes (Signed)
Orma Render, DNP, AGNP-c [address] [place], [place] [address] [phone]  Subjective:   Cheryl Sanchez is a 62 y.o. female presents to day for evaluation of: Cheryl Sanchez presents today with concerns regarding a persistent urinary tract infection (UTI) that remains unresolved.  She reports that after a course of Nitrofurantoin (Macrobid) prescribed by Dr. Redmond School a couple of weeks ago, there has been no improvement in her UTI symptoms. She does report going out of town during the treatment and she did miss a couple of doses.   Cheryl Sanchez denies any associated fevers, back pain, nausea, vomiting, or diarrhea. She did feel ill yesterday with chills, which is unusual for her. 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   PMH, Medications, and Allergies reviewed and updated in chart as appropriate.   ROS negative except for what is listed in HPI. Objective:  BP 128/82   Pulse 93   Wt 126 lb 6.4 oz (57.3 kg)   LMP 06/09/2012   BMI 23.47 kg/m  Physical Exam Vitals and nursing note reviewed.  Constitutional:      Appearance: Normal appearance. She is not ill-appearing.  HENT:     Head: Normocephalic.  Eyes:     Pupils: Pupils are equal, round, and reactive to light.  Cardiovascular:     Rate and Rhythm: Normal rate and regular rhythm.     Pulses: Normal pulses.     Heart sounds: Normal heart sounds.  Pulmonary:     Effort: Pulmonary effort is normal.     Breath sounds: Normal breath sounds.  Abdominal:     General: Abdomen is flat. Bowel sounds are normal. There is no distension.     Palpations: Abdomen is soft.     Tenderness: There is no abdominal tenderness. There is no right CVA tenderness, left CVA tenderness or guarding.  Musculoskeletal:     Cervical back: Normal range of motion.     Right lower leg: No edema.     Left lower leg: No edema.  Skin:    General: Skin is warm and dry.      Capillary Refill: Capillary refill takes less than 2 seconds.  Neurological:     General: No focal deficit present.     Mental Status: She is alert and oriented to person, place, and time.  Psychiatric:        Mood and Affect: Mood normal.           Assessment & Plan:   Problem List Items Addressed This Visit     Cystitis - Primary    The patient's urine test results indicate an ongoing infection, suggesting that the initial treatment with Nitrofurantoin (Macrobid) was ineffective. Considering this, we need to explore alternative antibiotic options. Consider possible vaginal atrophy associated with menopause as causation for increased UTI.   Plan: - Prescribe Ciprofloxacin for a six-day course, with instructions to discontinue after three days if symptoms resolve completely. - Monitor the patient's response to the new medication and watch for signs of recurrence or complications. - If UTIs become recurrent, evaluate the need for prophylactic measures, such as low-dose Macrobid or post-coital dosing. - Urine sent for culture to ensure appropriate therapy has been selected.  - Recommend using aquaphor to vaginal introitus and labia to help with atrophy of the tissue in effort to help reduce urinary symptoms.       Relevant Medications   ciprofloxacin (  CIPRO) 500 MG tablet   Other Visit Diagnoses     Frequent urination       Relevant Orders   POCT URINALYSIS DIP (CLINITEK) (Completed)   Urine Culture         Orma Render, DNP, AGNP-c 01/13/2023  10:35 AM    History, Medications, Surgery, SDOH, and Family History reviewed and updated as appropriate.

## 2023-01-13 NOTE — Patient Instructions (Signed)
You may want to try Aquaphor at the vaginal opening to see if this helps keep the area moist and help prevent the recurrence of UTI's.

## 2023-01-16 LAB — URINE CULTURE: Organism ID, Bacteria: NO GROWTH

## 2023-05-29 ENCOUNTER — Encounter: Payer: BC Managed Care – PPO | Admitting: Family Medicine

## 2023-05-30 ENCOUNTER — Ambulatory Visit: Payer: 59 | Admitting: Family Medicine

## 2023-05-30 ENCOUNTER — Encounter: Payer: Self-pay | Admitting: Family Medicine

## 2023-05-30 VITALS — BP 112/76 | HR 83 | Resp 14 | Ht 61.5 in | Wt 123.8 lb

## 2023-05-30 DIAGNOSIS — R7303 Prediabetes: Secondary | ICD-10-CM | POA: Insufficient documentation

## 2023-05-30 DIAGNOSIS — Z0000 Encounter for general adult medical examination without abnormal findings: Secondary | ICD-10-CM | POA: Diagnosis not present

## 2023-05-30 DIAGNOSIS — J309 Allergic rhinitis, unspecified: Secondary | ICD-10-CM

## 2023-05-30 DIAGNOSIS — Z1211 Encounter for screening for malignant neoplasm of colon: Secondary | ICD-10-CM

## 2023-05-30 DIAGNOSIS — Z23 Encounter for immunization: Secondary | ICD-10-CM

## 2023-05-30 DIAGNOSIS — Z8616 Personal history of COVID-19: Secondary | ICD-10-CM | POA: Insufficient documentation

## 2023-05-30 DIAGNOSIS — G4733 Obstructive sleep apnea (adult) (pediatric): Secondary | ICD-10-CM | POA: Diagnosis not present

## 2023-05-30 DIAGNOSIS — K5790 Diverticulosis of intestine, part unspecified, without perforation or abscess without bleeding: Secondary | ICD-10-CM

## 2023-05-30 DIAGNOSIS — Z1231 Encounter for screening mammogram for malignant neoplasm of breast: Secondary | ICD-10-CM

## 2023-05-30 NOTE — Progress Notes (Signed)
Complete physical exam  Patient: Cheryl Sanchez   DOB: 13-May-1961   62 y.o. Female  MRN: [id number]  Subjective:    Chief Complaint  Patient presents with   Annual Exam    Cheryl Sanchez is a 62 y.o. female who presents today for a complete physical exam. She reports consuming a general diet. Home exercise routine includes walking dogs 2-3 times a week. She generally feels fairly well. She reports sleeping fairly well. She does not have additional problems to discuss today.  She does have underlying allergies and is using OTC meds with good results.  Her gynecologist did order a Cologuard which was recently done and was negative.  She also had outside evaluation for OSA and is now using a dental appliance with good results.  Apparently she is scheduled for follow-up sleep study to further evaluate that.  She does have a history of diverticulosis and does note that sometimes seeded types of foods cause more trouble.  Otherwise her family and social history as well as health maintenance and immunizations was reviewed.   Most recent fall risk assessment:    05/30/2023    1:47 PM  Fall Risk   Falls in the past year? 0  Number falls in past yr: 0  Injury with Fall? 0  Risk for fall due to : No Fall Risks  Follow up Falls evaluation completed     Most recent depression screenings:    05/30/2023    1:47 PM 05/25/2022    2:52 PM  PHQ 2/9 Scores  PHQ - 2 Score 0 0    Vision:Within last year and Dental: No current dental problems    Patient Care Team: Ronnald Nian, MD as PCP - General (Family Medicine)   Outpatient Medications Prior to Visit  Medication Sig   acetaminophen (TYLENOL) 500 MG tablet Take 500 mg by mouth every 6 (six) hours as needed.   cetirizine (ZYRTEC) 5 MG tablet Take by mouth.   ibuprofen (ADVIL,MOTRIN) 200 MG tablet Take 200 mg by mouth every 6 (six) hours as needed.   Facility-Administered Medications Prior to Visit  Medication Dose Route Frequency  Provider   lidocaine-EPINEPHrine (XYLOCAINE-EPINEPHrine) 1 %-1:200000 (PF) injection 10 mL  10 mL Intradermal Once Ronnald Nian, MD    Review of Systems  All other systems reviewed and are negative.         Objective:     BP 112/76   Pulse 83   Resp 14   Ht 5' 1.5" (1.562 m)   Wt 123 lb 12.8 oz (56.2 kg)   LMP 06/09/2012   SpO2 98% Comment: room air  BMI 23.01 kg/m    Physical Exam  Alert and in no distress. Tympanic membranes and canals are normal. Pharyngeal area is normal. Neck is supple without adenopathy or thyromegaly. Cardiac exam shows a regular sinus rhythm without murmurs or gallops. Lungs are clear to auscultation.  Abdominal exam shows no masses or tenderness.      Assessment & Plan:    Routine general medical examination at a health care facility - Plan: CBC with Differential/Platelet, Comprehensive metabolic panel, Lipid panel  Allergic rhinitis, unspecified seasonality, unspecified trigger  Diverticulosis  OSA (obstructive sleep apnea)  Need for Tdap vaccination - Plan: Tdap vaccine greater than or equal to 7yo IM  Need for shingles vaccine - Plan: Varicella-zoster vaccine IM  History of COVID-19  Immunization History  Administered Date(s) Administered   Influenza Split 09/11/2012, 09/13/2021  Influenza-Unspecified 09/18/2017   PFIZER(Purple Top)SARS-COV-2 Vaccination 01/30/2020, 02/20/2020, 10/24/2020   Pfizer Covid Bivalent Pediatric Vaccine(32mos to <6yrs) 09/13/2021   Tdap 12/05/2011, 05/30/2023   Unspecified SARS-COV-2 Vaccination 11/09/2020   Zoster Recombinant(Shingrix) 05/25/2022, 05/30/2023    Health Maintenance  Topic Date Due   HIV Screening  Never done   COVID-19 Vaccine (6 - 2023-24 season) 06/15/2023 (Originally 07/22/2022)   MAMMOGRAM  06/17/2023   INFLUENZA VACCINE  06/22/2023   Fecal DNA (Cologuard)  06/13/2025   PAP SMEAR-Modifier  05/14/2026   DTaP/Tdap/Td (3 - Td or Tdap) 05/29/2033   Hepatitis C Screening   Completed   Zoster Vaccines- Shingrix  Completed   HPV VACCINES  Aged Out    Discussed health benefits of physical activity, and encouraged her to engage in regular exercise appropriate for her age and condition.  Problem List Items Addressed This Visit     Allergic rhinitis   Diverticulosis   OSA (obstructive sleep apnea)   Other Visit Diagnoses     Routine general medical examination at a health care facility    -  Primary   Relevant Orders   CBC with Differential/Platelet   Comprehensive metabolic panel   Lipid panel   Need for Tdap vaccination       Relevant Orders   Tdap vaccine greater than or equal to 7yo IM (Completed)   Need for shingles vaccine       Relevant Orders   Varicella-zoster vaccine IM (Completed)   History of COVID-19         She is up-to-date on all of her immunizations and health maintenance.  She will get follow-up on her OSA Follow-up 1 year     Sharlot Gowda, MD

## 2023-05-31 LAB — CBC WITH DIFFERENTIAL/PLATELET
Basophils Absolute: 0 10*3/uL (ref 0.0–0.2)
Basos: 0 %
EOS (ABSOLUTE): 0.1 10*3/uL (ref 0.0–0.4)
Eos: 1 %
Hematocrit: 42.4 % (ref 34.0–46.6)
Hemoglobin: 14.2 g/dL (ref 11.1–15.9)
Immature Grans (Abs): 0 10*3/uL (ref 0.0–0.1)
Immature Granulocytes: 0 %
Lymphocytes Absolute: 1.6 10*3/uL (ref 0.7–3.1)
Lymphs: 30 %
MCH: 30.8 pg (ref 26.6–33.0)
MCHC: 33.5 g/dL (ref 31.5–35.7)
MCV: 92 fL (ref 79–97)
Monocytes Absolute: 0.4 10*3/uL (ref 0.1–0.9)
Monocytes: 8 %
Neutrophils Absolute: 3.1 10*3/uL (ref 1.4–7.0)
Neutrophils: 61 %
Platelets: 282 10*3/uL (ref 150–450)
RBC: 4.61 x10E6/uL (ref 3.77–5.28)
RDW: 12.5 % (ref 11.7–15.4)
WBC: 5.2 10*3/uL (ref 3.4–10.8)

## 2023-05-31 LAB — COMPREHENSIVE METABOLIC PANEL
ALT: 17 IU/L (ref 0–32)
AST: 19 IU/L (ref 0–40)
Albumin: 4.6 g/dL (ref 3.9–4.9)
Alkaline Phosphatase: 87 IU/L (ref 44–121)
BUN/Creatinine Ratio: 16 (ref 12–28)
BUN: 12 mg/dL (ref 8–27)
Bilirubin Total: 0.3 mg/dL (ref 0.0–1.2)
CO2: 24 mmol/L (ref 20–29)
Calcium: 9.3 mg/dL (ref 8.7–10.3)
Chloride: 102 mmol/L (ref 96–106)
Creatinine, Ser: 0.73 mg/dL (ref 0.57–1.00)
Globulin, Total: 2.1 g/dL (ref 1.5–4.5)
Glucose: 136 mg/dL — ABNORMAL HIGH (ref 70–99)
Potassium: 4.1 mmol/L (ref 3.5–5.2)
Sodium: 141 mmol/L (ref 134–144)
Total Protein: 6.7 g/dL (ref 6.0–8.5)
eGFR: 93 mL/min/{1.73_m2} (ref >59)

## 2023-05-31 LAB — LIPID PANEL
Chol/HDL Ratio: 3.6 ratio (ref 0.0–4.4)
Cholesterol, Total: 205 mg/dL — ABNORMAL HIGH (ref 100–199)
HDL: 57 mg/dL (ref >39)
LDL Chol Calc (NIH): 115 mg/dL — ABNORMAL HIGH (ref 0–99)
Triglycerides: 190 mg/dL — ABNORMAL HIGH (ref 0–149)
VLDL Cholesterol Cal: 33 mg/dL (ref 5–40)

## 2023-06-02 LAB — SPECIMEN STATUS REPORT

## 2023-06-02 LAB — HGB A1C W/O EAG: Hgb A1c MFr Bld: 6 % — ABNORMAL HIGH (ref 4.8–5.6)

## 2023-11-09 ENCOUNTER — Ambulatory Visit (INDEPENDENT_AMBULATORY_CARE_PROVIDER_SITE_OTHER): Payer: No Typology Code available for payment source | Admitting: Medical

## 2023-11-09 ENCOUNTER — Ambulatory Visit
Admission: RE | Admit: 2023-11-09 | Discharge: 2023-11-09 | Disposition: A | Discharge status: Home / Self Care | Payer: No Typology Code available for payment source | Source: Ambulatory Visit | Attending: Medical | Admitting: Medical

## 2023-11-09 VITALS — BP 110/70 | HR 77 | Temp 97.9°F | Wt 126.4 lb

## 2023-11-09 DIAGNOSIS — M25512 Pain in left shoulder: Secondary | ICD-10-CM

## 2023-11-09 DIAGNOSIS — M79674 Pain in right toe(s): Secondary | ICD-10-CM

## 2023-11-09 DIAGNOSIS — R29898 Other symptoms and signs involving the musculoskeletal system: Secondary | ICD-10-CM

## 2023-11-09 DIAGNOSIS — S4992XA Unspecified injury of left shoulder and upper arm, initial encounter: Secondary | ICD-10-CM

## 2023-11-09 DIAGNOSIS — R200 Anesthesia of skin: Secondary | ICD-10-CM

## 2023-11-09 NOTE — Progress Notes (Signed)
Subjective:  Cheryl Sanchez is a 62 y.o. female who presents for Chief Complaint  Patient presents with   Fall    Fall over a month ago and still having left shoulder pain (can't lift high) and right big toe pain- feels like fluid in there.      Here pains.  She was in her basement a month ago, tripped over some pallets, and when this happened tried to reach and grab something with her left shoulder.  Injured left shoulder and right great toe when she started to fall forward.  She did not actually follow a out but landed abruptly on the ball of her feet and catching her fall by grabbing something with the left arm.  Since the injury she has had ongoing pains in right great toe and left shoulder.  She has had ongoing swelling and pain in the right great toe.  She feels numb at times in the toe.  The pain in the left shoulder is intermittent.  No numbness or tingling in the left arm.  No neck pain.  She is right-handed.  She has used cold therapy such as ice and analgesics over-the-counter and rest  No other aggravating or relieving factors.    No other c/o.  Past Medical History:  Diagnosis Date   Allergy    Endometriosis    Current Outpatient Medications on File Prior to Visit  Medication Sig Dispense Refill   acetaminophen (TYLENOL) 500 MG tablet Take 500 mg by mouth every 6 (six) hours as needed.     cetirizine (ZYRTEC) 5 MG tablet Take by mouth.     ibuprofen (ADVIL,MOTRIN) 200 MG tablet Take 200 mg by mouth every 6 (six) hours as needed.     Current Facility-Administered Medications on File Prior to Visit  Medication Dose Route Frequency Provider Last Rate Last Admin   lidocaine-EPINEPHrine (XYLOCAINE-EPINEPHrine) 1 %-1:200000 (PF) injection 10 mL  10 mL Intradermal Once Cheryl Nian, MD        The following portions of the patient's history were reviewed and updated as appropriate: allergies, current medications, past family history, past medical history, past social  history, past surgical history and problem list.  ROS Otherwise as in subjective above  Objective: BP 110/70   Pulse 77   Temp 97.9 F (36.6 C)   Wt 126 lb 6.4 oz (57.3 kg)   LMP 06/09/2012   BMI 23.50 kg/m   General appearance: alert, no distress, well developed, well nourished Neck: supple, no lymphadenopathy, no thyromegaly, no masses There is soft tissue swelling of the right great toe distally volar surface, tender over the DIP of right great toe.  She seems to have pretty good range of motion of the right great toe but there is pain with range of motion with extension and flexion.  MTP nontender.  Rest of foot unremarkable, nontender normal range of motion. Nontender to palpation of the left shoulder and left arm.  Passive and active range of motion a little decreased of the left shoulder internal and external, there is some weakness of the left shoulder with external rotation, some subtle weakness with empty can test, subtle laxity, otherwise no swelling no deformity nontender otherwise Arms and feet neurovascularly intact     Assessment: Encounter Diagnoses  Name Primary?   Pain of right great toe Yes   Left shoulder pain, unspecified chronicity    Weakness of left shoulder    Injury of left shoulder, initial encounter    Numbness  of toes      Plan: I will have her go for x-ray of the right great toe Referral to sports medicine for the left shoulder pain and weakness She can use over-the-counter Aleve as needed for pain and inflammation She can use cold therapy as needed particular worse pain Avoid heavy lifting and use relative rest of both injuries, toe and arm for the time being We discussed possibly using the protective boot or shoe, but she will go get the x-ray today hopefully.  She has been protecting the toe for the past month   Cheryl Sanchez was seen today for fall.  Diagnoses and all orders for this visit:  Pain of right great toe -     DG Toe Great Right;  Future  Left shoulder pain, unspecified chronicity -     Ambulatory referral to Sports Medicine  Weakness of left shoulder -     Ambulatory referral to Sports Medicine  Injury of left shoulder, initial encounter -     Ambulatory referral to Sports Medicine  Numbness of toes -     DG Toe Great Right; Future    Follow up: pending xray, referral

## 2023-11-09 NOTE — Patient Instructions (Addendum)
Please go to [facility] for your toe xray.   Their hours are 8am - 4:30 pm Monday - Friday.  Take your insurance card with you.  [facility] [phone]  [address] [place], [place] [address]

## 2023-11-18 NOTE — Progress Notes (Signed)
Results sent through MyChart

## 2023-11-23 ENCOUNTER — Other Ambulatory Visit: Payer: Self-pay

## 2023-11-23 ENCOUNTER — Ambulatory Visit: Payer: Managed Care, Other (non HMO) | Admitting: Family Medicine

## 2023-11-23 ENCOUNTER — Encounter: Payer: Self-pay | Admitting: Family Medicine

## 2023-11-23 VITALS — BP 110/78 | Ht 62.0 in | Wt 125.0 lb

## 2023-11-23 DIAGNOSIS — M25512 Pain in left shoulder: Secondary | ICD-10-CM

## 2023-11-23 DIAGNOSIS — S93521A Sprain of metatarsophalangeal joint of right great toe, initial encounter: Secondary | ICD-10-CM

## 2023-11-23 MED ORDER — MELOXICAM 15 MG PO TABS: 15.0000 mg | ORAL_TABLET | Freq: Every day | ORAL | 1 refills | Status: AC

## 2023-11-23 NOTE — Patient Instructions (Signed)
 You have subacromial bursitis of your left shoulder and a resolving AC sprain. Try to avoid painful activities (overhead activities, lifting with extended arm) as much as possible. Meloxicam  15 mg daily with food for pain and inflammation - take for 7 days then as needed. Can take tylenol in addition to this. Subacromial injection may be beneficial to help with pain and to decrease inflammation. Start physical therapy with transition to home exercise program. Do home exercise program with theraband and scapular stabilization exercises daily 3 sets of 10 once a day. If not improving at follow-up we will consider injection and/or nitro patches. Follow up with me in 5-6 weeks.  Your great toe ultrasound is reassuring - the collateral ligament is thickened and there's some fluid around the plantar plate suggesting sprain and healing turf toe. Consider buddy taping, hard soled shoe both for comfort.

## 2023-11-23 NOTE — Progress Notes (Signed)
 PCP: Joyce Norleen BROCKS, MD  Subjective:   HPI: Patient is a 63 y.o. female here for left shoulder pain.  Patient reports about 6 weeks ago she tripped and fell injuring her left shoulder. Isn't sure exactly how she fell down - thinks may have reached to catch herself. Since that time having difficulty reaching overhead, forward, out to side with left arm due to shoulder pain. No swelling or bruising. No numbness. Has history of left pec injury. Also injured her right great toe during this - had negative x-rays but still with swelling more on plantar aspect.  Past Medical History:  Diagnosis Date   Allergy    Endometriosis     Current Outpatient Medications on File Prior to Visit  Medication Sig Dispense Refill   acetaminophen (TYLENOL) 500 MG tablet Take 500 mg by mouth every 6 (six) hours as needed.     cetirizine (ZYRTEC) 5 MG tablet Take by mouth.     ibuprofen (ADVIL,MOTRIN) 200 MG tablet Take 200 mg by mouth every 6 (six) hours as needed.     Current Facility-Administered Medications on File Prior to Visit  Medication Dose Route Frequency Provider Last Rate Last Admin   lidocaine -EPINEPHrine  (XYLOCAINE -EPINEPHrine ) 1 %-1:200000 (PF) injection 10 mL  10 mL Intradermal Once Lalonde, John C, MD        History reviewed. No pertinent surgical history.  Allergies  Allergen Reactions   Bactrim  [Sulfamethoxazole -Trimethoprim ]     BP 110/78   Ht 5' 2 (1.575 m)   Wt 125 lb (56.7 kg)   LMP 06/09/2012   BMI 22.86 kg/m       No data to display              No data to display              Objective:  Physical Exam:  Gen: NAD, comfortable in exam room  Left shoulder: No swelling, ecchymoses.  No gross deformity. Mild TTP AC joint.  No other tenderness FROM with painful arc. Negative Hawkins, Neers. Negative Yergasons. Strength 5/5 with empty can and resisted internal/external rotation.  Mild pain empty can. Negative apprehension. NV intact  distally.  Right great toe: Swelling, bruising plantar medial great toe.  No malrotation or angulation Able to flex and extend at IP and MTP joints. Tenderness to palpation 1st MTP dorsally and medially.  No other tenderness. NVI distally.   Complete MSK u/s left shoulder: Biceps tendon: intact long and short views without tenosynovitis Pec major tendon: distal tendon intact Subscapularis: intact without abnormalities AC joint: geyser sign without malalignment.  Mild arthropathy Infraspinatus: intact without tear Supraspinatus: mod subacromial bursitis.  No tears.  Small intratendinous calcification suspicious for healed partial tear Posterior glenohumeral joint: no effusion, paralabral cyst.  Impression: subacromial bursitis.  AC joint effusion - likely Grade 1 or 2 AC sprain.  Limited ultrasound Right Great toe - thickening of medial collateral ligament of 1st MTP.  No joint effusion.  Flexor and extensor hallucis tendons intact.  Sesamoids appear normal.  Small amount of fluid superficial to tarsal plat 1st MTP.  Consistent with healing sprain and turf toe.  Assessment & Plan:  1. Left shoulder injury - ultrasound reassuring against rotator cuff tear.  Consistent with bursitis and likely low grade AC sprain.  Start physical therapy, meloxicam .  Consider injection, nitroglycerin patches if not improving.  F/u in 5-6 weeks.  2. Right great toe injury - consistent with sprain and turf toe.  Discussed buddy taping.  Consider hard soled shoe for comfort but she is 6+ weeks out already - do not think this is necessary at this point.  Icing if needed.

## 2024-01-16 ENCOUNTER — Encounter: Payer: Self-pay | Admitting: Internal Medicine

## 2024-05-30 ENCOUNTER — Ambulatory Visit (INDEPENDENT_AMBULATORY_CARE_PROVIDER_SITE_OTHER): Payer: 59 | Admitting: Family Medicine

## 2024-05-30 ENCOUNTER — Encounter: Payer: Self-pay | Admitting: Family Medicine

## 2024-05-30 VITALS — BP 126/88 | HR 56 | Ht 61.0 in | Wt 128.6 lb

## 2024-05-30 DIAGNOSIS — R7303 Prediabetes: Secondary | ICD-10-CM

## 2024-05-30 DIAGNOSIS — Z13220 Encounter for screening for lipoid disorders: Secondary | ICD-10-CM

## 2024-05-30 DIAGNOSIS — J301 Allergic rhinitis due to pollen: Secondary | ICD-10-CM

## 2024-05-30 DIAGNOSIS — K5790 Diverticulosis of intestine, part unspecified, without perforation or abscess without bleeding: Secondary | ICD-10-CM | POA: Diagnosis not present

## 2024-05-30 DIAGNOSIS — Z0000 Encounter for general adult medical examination without abnormal findings: Secondary | ICD-10-CM

## 2024-05-30 DIAGNOSIS — G4733 Obstructive sleep apnea (adult) (pediatric): Secondary | ICD-10-CM | POA: Diagnosis not present

## 2024-05-30 LAB — POCT GLYCOSYLATED HEMOGLOBIN (HGB A1C): Hemoglobin A1C: 6.2 % — AB (ref 4.0–5.6)

## 2024-05-30 LAB — LIPID PANEL

## 2024-05-30 NOTE — Progress Notes (Signed)
 Complete physical exam  Patient: Cheryl Sanchez   DOB: Oct 25, 1961   63 y.o. Female  MRN: [id number]  Subjective:    Chief Complaint  Patient presents with   Annual Exam    Cpe.     Cheryl Sanchez is a 63 y.o. female who presents today for a complete physical exam.  She reports consuming a general diet. Walking dogs daily. She generally feels well. She reports sleeping well.  She does have OSA and does have a dental appliance that she uses.  She has had this checked in her OSA has improved with using that.  She does complain of some slight weight gain as well as increased gas.  Her allergies seem to be under good control.  She does have a history of prediabetes.  She also has a history of diverticulosis and I think in the past has had least 1 episode of diverticulitis.  She also says that she gets recurrent UTIs and usually takes care of this will drinking extra fluids.  Otherwise she has no particular concerns or complaints.  Work and home life are going well.  Most recent fall risk assessment:    05/30/2024    1:38 PM  Fall Risk   Falls in the past year? 1  Number falls in past yr: 0  Injury with Fall? 0  Risk for fall due to : No Fall Risks  Follow up Falls evaluation completed     Most recent depression screenings:    05/30/2024    1:39 PM 05/30/2023    1:47 PM  PHQ 2/9 Scores  PHQ - 2 Score 0 0    Vision:Not within last year  and Dental: No current dental problems and Receives regular dental care    Immunization History  Administered Date(s) Administered   Influenza Split 09/11/2012, 09/13/2021   Influenza-Unspecified 09/18/2017   PFIZER(Purple Top)SARS-COV-2 Vaccination 01/30/2020, 02/20/2020, 10/24/2020   Pfizer Covid Bivalent Pediatric Vaccine(52mos to <52yrs) 09/13/2021   Tdap 12/05/2011, 05/30/2023   Unspecified SARS-COV-2 Vaccination 11/09/2020   Zoster Recombinant(Shingrix ) 05/25/2022, 05/30/2023    Health Maintenance  Topic Date Due   HIV Screening   Never done   MAMMOGRAM  06/17/2023   COVID-19 Vaccine (6 - 2024-25 season) 07/23/2023   INFLUENZA VACCINE  06/21/2024   Fecal DNA (Cologuard)  06/13/2025   Cervical Cancer Screening (HPV/Pap Cotest)  05/14/2026   DTaP/Tdap/Td (3 - Td or Tdap) 05/29/2033   Hepatitis C Screening  Completed   Zoster Vaccines- Shingrix   Completed   Hepatitis B Vaccines  Aged Out   HPV VACCINES  Aged Out   Meningococcal B Vaccine  Aged Out    Patient Care Team: Joyce Norleen BROCKS, MD as PCP - General (Family Medicine)   Outpatient Medications Prior to Visit  Medication Sig   acetaminophen (TYLENOL) 500 MG tablet Take 500 mg by mouth every 6 (six) hours as needed.   cetirizine (ZYRTEC) 5 MG tablet Take by mouth.   ibuprofen (ADVIL,MOTRIN) 200 MG tablet Take 200 mg by mouth every 6 (six) hours as needed.   meloxicam  (MOBIC ) 15 MG tablet Take 1 tablet (15 mg total) by mouth daily. (Patient not taking: Reported on 05/30/2024)   Facility-Administered Medications Prior to Visit  Medication Dose Route Frequency Provider   lidocaine -EPINEPHrine  (XYLOCAINE -EPINEPHrine ) 1 %-1:200000 (PF) injection 10 mL  10 mL Intradermal Once Shelbee Apgar C, MD    Review of Systems  All other systems reviewed and are negative.   Family and social history  as well as health maintenance and immunizations was reviewed.     Objective:    BP 126/88   Pulse (!) 56   Ht 5' 1 (1.549 m)   Wt 128 lb 9.6 oz (58.3 kg)   LMP 06/09/2012   SpO2 97%   BMI 24.30 kg/m    Physical Exam  Alert and in no distress. Tympanic membranes and canals are normal. Pharyngeal area is normal. Neck is supple without adenopathy or thyromegaly. Cardiac exam shows a regular sinus rhythm without murmurs or gallops. Lungs are clear to auscultation Hemoglobin A1c is 6.2     Assessment & Plan:    her to engage in regular exercise appropriate for her age and condition.  Routine general medical examination at a health care facility  Prediabetes -  Plan: POCT glycosylated hemoglobin (Hb A1C)  OSA (obstructive sleep apnea)  Diverticulosis - Plan: CBC with Differential/Platelet, Comprehensive metabolic panel with GFR  Seasonal allergic rhinitis due to pollen - Plan: CBC with Differential/Platelet, Comprehensive metabolic panel with GFR  Screening for lipid disorders - Plan: Lipid panel  I discussed prediabetes with her in terms of risk for diabetes and again encouraged her to make diet and exercise changes which she has already started to do.  She plans to follow-up with immunizations at a better time for her schedule. Return in about 1 year (around 05/30/2025).      Norleen Jobs, MD

## 2024-05-31 ENCOUNTER — Ambulatory Visit: Payer: Self-pay | Admitting: Family Medicine

## 2024-05-31 LAB — CBC WITH DIFFERENTIAL/PLATELET
Basophils Absolute: 0 x10E3/uL (ref 0.0–0.2)
Basos: 1 %
EOS (ABSOLUTE): 0.1 x10E3/uL (ref 0.0–0.4)
Eos: 1 %
Hematocrit: 45 % (ref 34.0–46.6)
Hemoglobin: 15.2 g/dL (ref 11.1–15.9)
Immature Grans (Abs): 0 x10E3/uL (ref 0.0–0.1)
Immature Granulocytes: 0 %
Lymphocytes Absolute: 1.5 x10E3/uL (ref 0.7–3.1)
Lymphs: 26 %
MCH: 32.1 pg (ref 26.6–33.0)
MCHC: 33.8 g/dL (ref 31.5–35.7)
MCV: 95 fL (ref 79–97)
Monocytes Absolute: 0.4 x10E3/uL (ref 0.1–0.9)
Monocytes: 8 %
Neutrophils Absolute: 3.7 x10E3/uL (ref 1.4–7.0)
Neutrophils: 64 %
Platelets: 311 x10E3/uL (ref 150–450)
RBC: 4.74 x10E6/uL (ref 3.77–5.28)
RDW: 12.5 % (ref 11.7–15.4)
WBC: 5.8 x10E3/uL (ref 3.4–10.8)

## 2024-05-31 LAB — COMPREHENSIVE METABOLIC PANEL WITH GFR
ALT: 22 IU/L (ref 0–32)
AST: 20 IU/L (ref 0–40)
Albumin: 4.7 g/dL (ref 3.9–4.9)
Alkaline Phosphatase: 91 IU/L (ref 44–121)
BUN/Creatinine Ratio: 19 (ref 12–28)
BUN: 13 mg/dL (ref 8–27)
Bilirubin Total: 0.4 mg/dL (ref 0.0–1.2)
CO2: 19 mmol/L — ABNORMAL LOW (ref 20–29)
Calcium: 9 mg/dL (ref 8.7–10.3)
Chloride: 104 mmol/L (ref 96–106)
Creatinine, Ser: 0.68 mg/dL (ref 0.57–1.00)
Globulin, Total: 2.4 g/dL (ref 1.5–4.5)
Glucose: 102 mg/dL — ABNORMAL HIGH (ref 70–99)
Potassium: 4.4 mmol/L (ref 3.5–5.2)
Sodium: 138 mmol/L (ref 134–144)
Total Protein: 7.1 g/dL (ref 6.0–8.5)
eGFR: 98 mL/min/1.73 (ref >59)

## 2024-05-31 LAB — LIPID PANEL
Chol/HDL Ratio: 3.6 ratio (ref 0.0–4.4)
Cholesterol, Total: 215 mg/dL — ABNORMAL HIGH (ref 100–199)
HDL: 60 mg/dL (ref >39)
LDL Chol Calc (NIH): 133 mg/dL — ABNORMAL HIGH (ref 0–99)
Triglycerides: 124 mg/dL (ref 0–149)
VLDL Cholesterol Cal: 22 mg/dL (ref 5–40)

## 2024-06-14 ENCOUNTER — Other Ambulatory Visit

## 2024-08-28 ENCOUNTER — Other Ambulatory Visit: Payer: Self-pay | Admitting: Medical Genetics

## 2024-08-28 DIAGNOSIS — Z006 Encounter for examination for normal comparison and control in clinical research program: Secondary | ICD-10-CM

## 2024-09-02 ENCOUNTER — Ambulatory Visit: Payer: Self-pay

## 2024-09-02 NOTE — Telephone Encounter (Signed)
 FYI Only or Action Required?: FYI only for provider.  Patient was last seen in primary care on 05/30/2024 by Joyce Norleen BROCKS, MD.  Called Nurse Triage reporting Urinary Frequency.  Symptoms began a week ago.  Interventions attempted: Nothing.  Symptoms are: unchanged.  Triage Disposition: See Physician Within 24 Hours  Patient/caregiver understands and will follow disposition?: Yes      Copied from CRM #[id number]. Topic: Clinical - Red Word Triage >> Sep 02, 2024 11:40 AM [name] wrote: [name] that prompted transfer to Nurse Triage: Patient calling, states she is not feeling well, thinks she may have a UTI, frequency with urination. Also has with intense back pain, and headache. Reason for Disposition  Urinating more frequently than usual (i.e., frequency) OR new-onset of the feeling of an urgent need to urinate (i.e., urgency)  Answer Assessment - Initial Assessment Questions 1. SYMPTOM: What's the main symptom you're concerned about? (e.g., frequency, incontinence)     frequency 2. ONSET: When did the  frequency  start?     About a week ago 3. PAIN: Is there any pain? If Yes, ask: How bad is it? (Scale: 1-10; mild, moderate, severe)     5/10 4. CAUSE: What do you think is causing the symptoms?     Thinks uti 5. OTHER SYMPTOMS: Do you have any other symptoms? (e.g., blood in urine, fever, flank pain, pain with urination)     Headache, backache,  Protocols used: Urinary Symptoms-A-AH

## 2024-09-03 ENCOUNTER — Ambulatory Visit: Admitting: Family Medicine

## 2024-09-03 ENCOUNTER — Encounter: Payer: Self-pay | Admitting: Family Medicine

## 2024-09-03 VITALS — BP 134/78 | HR 82 | Ht 61.0 in | Wt 130.4 lb

## 2024-09-03 DIAGNOSIS — N3001 Acute cystitis with hematuria: Secondary | ICD-10-CM | POA: Diagnosis not present

## 2024-09-03 MED ORDER — DOXYCYCLINE HYCLATE 100 MG PO TABS: 100.0000 mg | ORAL_TABLET | Freq: Two times a day (BID) | ORAL | 0 refills | Status: AC

## 2024-09-03 NOTE — Progress Notes (Signed)
   Subjective:    Patient ID: Cheryl Sanchez, female    DOB: 1961-09-30, 63 y.o.   MRN: [id number]  Discussed the use of AI scribe software for clinical note transcription with the patient, who gave verbal consent to proceed.  History of Present Illness   Cheryl Sanchez is a 63 year old female who presents with urinary frequency.  She has been experiencing urinary frequency for the past week, urinating approximately three to four times a day, which is unusual for her. She often feels on the verge of a urinary tract infection but typically does not experience such frequency.  No back pain, burning, or stinging sensations, which is atypical for her usual discomfort associated with urinary issues. Despite the frequent urge to urinate, she is able to void a significant amount each time.  She has been increasing her fluid intake to flush out her system, contributing to her ability to urinate more.  She is not currently menstruating and has not had a menstrual period in many years.           Review of Systems     Objective:    Physical Exam Alert and in no distress otherwise not examined.  Urine dipstick was essentially negative.  With a small amount of blood             Assessment & Plan:     Urinary tract infection Well-hydrated status complicates urinalysis. Decision to treat based on clinical assessment. - Initiated treatment for urinary tract infection.  With doxycycline as she is allergic to sulfa

## 2024-09-07 LAB — GENECONNECT MOLECULAR SCREEN: Genetic Analysis Overall Interpretation: NEGATIVE

## 2025-06-04 ENCOUNTER — Encounter: Payer: Self-pay | Admitting: Family Medicine
